# Patient Record
Sex: Female | Born: 1945 | Race: White | Hispanic: No | Smoking: Never smoker
Health system: Southern US, Community
[De-identification: ages and names within clinical notes are randomized; demographics above are authoritative.]

## PROBLEM LIST (undated history)

## (undated) DIAGNOSIS — I10 Essential (primary) hypertension: Secondary | ICD-10-CM

## (undated) DIAGNOSIS — F329 Major depressive disorder, single episode, unspecified: Secondary | ICD-10-CM

## (undated) DIAGNOSIS — F419 Anxiety disorder, unspecified: Secondary | ICD-10-CM

## (undated) DIAGNOSIS — E559 Vitamin D deficiency, unspecified: Secondary | ICD-10-CM

## (undated) DIAGNOSIS — F32A Depression, unspecified: Secondary | ICD-10-CM

## (undated) DIAGNOSIS — F0390 Unspecified dementia without behavioral disturbance: Secondary | ICD-10-CM

## (undated) DIAGNOSIS — E785 Hyperlipidemia, unspecified: Secondary | ICD-10-CM

## (undated) DIAGNOSIS — R413 Other amnesia: Secondary | ICD-10-CM

## (undated) DIAGNOSIS — E079 Disorder of thyroid, unspecified: Secondary | ICD-10-CM

## (undated) HISTORY — DX: Other amnesia: R41.3

## (undated) HISTORY — PX: ABDOMINAL HYSTERECTOMY: SHX81

## (undated) HISTORY — DX: Essential (primary) hypertension: I10

## (undated) HISTORY — DX: Disorder of thyroid, unspecified: E07.9

## (undated) HISTORY — DX: Major depressive disorder, single episode, unspecified: F32.9

## (undated) HISTORY — DX: Hyperlipidemia, unspecified: E78.5

## (undated) HISTORY — DX: Depression, unspecified: F32.A

---

## 2004-09-14 ENCOUNTER — Ambulatory Visit: Payer: Self-pay | Admitting: Internal Medicine

## 2005-09-16 ENCOUNTER — Ambulatory Visit: Payer: Self-pay | Admitting: Internal Medicine

## 2006-11-30 ENCOUNTER — Ambulatory Visit: Payer: Self-pay | Admitting: Orthopaedic Surgery

## 2006-12-28 ENCOUNTER — Ambulatory Visit: Payer: Self-pay | Admitting: Orthopaedic Surgery

## 2007-01-04 ENCOUNTER — Ambulatory Visit: Payer: Self-pay | Admitting: Orthopaedic Surgery

## 2007-08-31 ENCOUNTER — Ambulatory Visit: Payer: Self-pay | Admitting: Internal Medicine

## 2008-09-10 ENCOUNTER — Ambulatory Visit: Payer: Self-pay | Admitting: Internal Medicine

## 2009-09-12 ENCOUNTER — Ambulatory Visit: Payer: Self-pay | Admitting: Internal Medicine

## 2010-09-15 ENCOUNTER — Ambulatory Visit: Payer: Self-pay | Admitting: Internal Medicine

## 2010-12-29 ENCOUNTER — Ambulatory Visit: Payer: Self-pay | Admitting: Internal Medicine

## 2011-01-28 ENCOUNTER — Other Ambulatory Visit: Payer: Self-pay | Admitting: Neurology

## 2011-01-28 DIAGNOSIS — G459 Transient cerebral ischemic attack, unspecified: Secondary | ICD-10-CM

## 2011-01-28 DIAGNOSIS — E039 Hypothyroidism, unspecified: Secondary | ICD-10-CM

## 2011-01-28 DIAGNOSIS — I10 Essential (primary) hypertension: Secondary | ICD-10-CM

## 2011-01-28 DIAGNOSIS — E785 Hyperlipidemia, unspecified: Secondary | ICD-10-CM

## 2011-02-04 ENCOUNTER — Ambulatory Visit
Admission: RE | Admit: 2011-02-04 | Discharge: 2011-02-04 | Disposition: A | Discharge status: Home / Self Care | Payer: Medicare Other | Source: Ambulatory Visit | Attending: Neurology | Admitting: Neurology

## 2011-02-04 DIAGNOSIS — I10 Essential (primary) hypertension: Secondary | ICD-10-CM

## 2011-02-04 DIAGNOSIS — E785 Hyperlipidemia, unspecified: Secondary | ICD-10-CM

## 2011-02-04 DIAGNOSIS — G459 Transient cerebral ischemic attack, unspecified: Secondary | ICD-10-CM

## 2011-02-04 DIAGNOSIS — E039 Hypothyroidism, unspecified: Secondary | ICD-10-CM

## 2011-09-17 ENCOUNTER — Ambulatory Visit: Payer: Self-pay | Admitting: Internal Medicine

## 2012-09-18 ENCOUNTER — Ambulatory Visit: Payer: Self-pay | Admitting: Internal Medicine

## 2012-10-19 ENCOUNTER — Ambulatory Visit: Payer: Self-pay | Admitting: Gastroenterology

## 2012-10-30 ENCOUNTER — Ambulatory Visit: Payer: Self-pay | Admitting: Gastroenterology

## 2013-09-19 ENCOUNTER — Ambulatory Visit: Payer: Self-pay

## 2014-09-23 ENCOUNTER — Ambulatory Visit: Payer: Self-pay

## 2014-09-30 DIAGNOSIS — F32A Depression, unspecified: Secondary | ICD-10-CM | POA: Insufficient documentation

## 2014-09-30 DIAGNOSIS — F329 Major depressive disorder, single episode, unspecified: Secondary | ICD-10-CM | POA: Insufficient documentation

## 2015-03-12 ENCOUNTER — Other Ambulatory Visit: Payer: Self-pay | Admitting: Neurology

## 2015-03-12 DIAGNOSIS — R200 Anesthesia of skin: Secondary | ICD-10-CM

## 2015-03-12 DIAGNOSIS — R413 Other amnesia: Secondary | ICD-10-CM

## 2015-03-19 ENCOUNTER — Ambulatory Visit
Admission: RE | Admit: 2015-03-19 | Discharge: 2015-03-19 | Disposition: A | Discharge status: Home / Self Care | Payer: Medicare Other | Source: Ambulatory Visit | Attending: Neurology | Admitting: Neurology

## 2015-03-19 DIAGNOSIS — R531 Weakness: Secondary | ICD-10-CM | POA: Diagnosis present

## 2015-03-19 DIAGNOSIS — I6782 Cerebral ischemia: Secondary | ICD-10-CM | POA: Insufficient documentation

## 2015-03-19 DIAGNOSIS — R200 Anesthesia of skin: Secondary | ICD-10-CM | POA: Diagnosis present

## 2015-03-19 DIAGNOSIS — R413 Other amnesia: Secondary | ICD-10-CM | POA: Diagnosis present

## 2016-01-23 ENCOUNTER — Other Ambulatory Visit: Payer: Self-pay | Admitting: Infectious Diseases

## 2016-01-23 DIAGNOSIS — Z1231 Encounter for screening mammogram for malignant neoplasm of breast: Secondary | ICD-10-CM

## 2016-01-26 ENCOUNTER — Ambulatory Visit
Admission: RE | Admit: 2016-01-26 | Discharge: 2016-01-26 | Disposition: A | Discharge status: Home / Self Care | Payer: Medicare Other | Source: Ambulatory Visit | Attending: Infectious Diseases | Admitting: Infectious Diseases

## 2016-01-26 ENCOUNTER — Other Ambulatory Visit: Payer: Self-pay | Admitting: Infectious Diseases

## 2016-01-26 DIAGNOSIS — Z1231 Encounter for screening mammogram for malignant neoplasm of breast: Secondary | ICD-10-CM | POA: Diagnosis not present

## 2016-07-24 DIAGNOSIS — F0280 Dementia in other diseases classified elsewhere without behavioral disturbance: Secondary | ICD-10-CM | POA: Insufficient documentation

## 2016-07-24 DIAGNOSIS — G3101 Pick's disease: Secondary | ICD-10-CM

## 2016-10-04 DIAGNOSIS — G8929 Other chronic pain: Secondary | ICD-10-CM | POA: Insufficient documentation

## 2016-10-04 DIAGNOSIS — M542 Cervicalgia: Secondary | ICD-10-CM

## 2017-01-12 ENCOUNTER — Other Ambulatory Visit: Payer: Self-pay | Admitting: Neurology

## 2017-01-12 DIAGNOSIS — R413 Other amnesia: Secondary | ICD-10-CM

## 2017-01-17 ENCOUNTER — Ambulatory Visit: Payer: Federal, State, Local not specified - PPO

## 2017-01-27 ENCOUNTER — Ambulatory Visit
Admission: RE | Admit: 2017-01-27 | Discharge: 2017-01-27 | Disposition: A | Discharge status: Home / Self Care | Payer: Medicare Other | Source: Ambulatory Visit | Attending: Neurology | Admitting: Neurology

## 2017-01-27 DIAGNOSIS — I672 Cerebral atherosclerosis: Secondary | ICD-10-CM | POA: Diagnosis not present

## 2017-01-27 DIAGNOSIS — R41841 Cognitive communication deficit: Secondary | ICD-10-CM | POA: Diagnosis present

## 2017-01-27 DIAGNOSIS — I6782 Cerebral ischemia: Secondary | ICD-10-CM | POA: Diagnosis not present

## 2017-01-27 DIAGNOSIS — R413 Other amnesia: Secondary | ICD-10-CM

## 2017-03-09 ENCOUNTER — Other Ambulatory Visit: Payer: Self-pay | Admitting: Infectious Diseases

## 2017-03-09 DIAGNOSIS — Z1231 Encounter for screening mammogram for malignant neoplasm of breast: Secondary | ICD-10-CM

## 2017-03-24 ENCOUNTER — Ambulatory Visit
Admission: RE | Admit: 2017-03-24 | Discharge: 2017-03-24 | Disposition: A | Discharge status: Home / Self Care | Payer: Medicare Other | Source: Ambulatory Visit | Attending: Infectious Diseases | Admitting: Infectious Diseases

## 2017-03-24 DIAGNOSIS — Z1231 Encounter for screening mammogram for malignant neoplasm of breast: Secondary | ICD-10-CM | POA: Diagnosis present

## 2017-11-14 ENCOUNTER — Ambulatory Visit
Admission: RE | Admit: 2017-11-14 | Discharge: 2017-11-14 | Disposition: A | Discharge status: Home / Self Care | Payer: Medicare Other | Source: Ambulatory Visit | Attending: Physician Assistant | Admitting: Physician Assistant

## 2017-11-14 ENCOUNTER — Other Ambulatory Visit: Payer: Self-pay | Admitting: Physician Assistant

## 2017-11-14 DIAGNOSIS — R4182 Altered mental status, unspecified: Secondary | ICD-10-CM | POA: Diagnosis present

## 2017-11-14 DIAGNOSIS — G319 Degenerative disease of nervous system, unspecified: Secondary | ICD-10-CM | POA: Diagnosis not present

## 2017-11-14 DIAGNOSIS — R410 Disorientation, unspecified: Secondary | ICD-10-CM

## 2017-11-14 DIAGNOSIS — G9389 Other specified disorders of brain: Secondary | ICD-10-CM | POA: Diagnosis not present

## 2017-12-12 ENCOUNTER — Other Ambulatory Visit
Admission: RE | Admit: 2017-12-12 | Discharge: 2017-12-12 | Disposition: A | Discharge status: Home / Self Care | Payer: Medicare Other | Source: Ambulatory Visit | Attending: Family Medicine | Admitting: Family Medicine

## 2017-12-12 ENCOUNTER — Ambulatory Visit (INDEPENDENT_AMBULATORY_CARE_PROVIDER_SITE_OTHER): Payer: Medicare Other | Admitting: Family Medicine

## 2017-12-12 ENCOUNTER — Encounter: Payer: Self-pay | Admitting: Family Medicine

## 2017-12-12 VITALS — BP 117/78 | HR 62 | Resp 16 | Ht 62.0 in | Wt 134.0 lb

## 2017-12-12 DIAGNOSIS — E785 Hyperlipidemia, unspecified: Secondary | ICD-10-CM | POA: Diagnosis not present

## 2017-12-12 DIAGNOSIS — F0150 Vascular dementia without behavioral disturbance: Secondary | ICD-10-CM

## 2017-12-12 DIAGNOSIS — I10 Essential (primary) hypertension: Secondary | ICD-10-CM

## 2017-12-12 DIAGNOSIS — F0280 Dementia in other diseases classified elsewhere without behavioral disturbance: Secondary | ICD-10-CM

## 2017-12-12 DIAGNOSIS — E039 Hypothyroidism, unspecified: Secondary | ICD-10-CM | POA: Diagnosis not present

## 2017-12-12 DIAGNOSIS — R2681 Unsteadiness on feet: Secondary | ICD-10-CM | POA: Insufficient documentation

## 2017-12-12 DIAGNOSIS — E119 Type 2 diabetes mellitus without complications: Secondary | ICD-10-CM | POA: Diagnosis not present

## 2017-12-12 DIAGNOSIS — M542 Cervicalgia: Secondary | ICD-10-CM | POA: Diagnosis not present

## 2017-12-12 DIAGNOSIS — G8929 Other chronic pain: Secondary | ICD-10-CM

## 2017-12-12 DIAGNOSIS — G309 Alzheimer's disease, unspecified: Secondary | ICD-10-CM

## 2017-12-12 DIAGNOSIS — E559 Vitamin D deficiency, unspecified: Secondary | ICD-10-CM

## 2017-12-12 DIAGNOSIS — F329 Major depressive disorder, single episode, unspecified: Secondary | ICD-10-CM | POA: Diagnosis not present

## 2017-12-12 DIAGNOSIS — F32A Depression, unspecified: Secondary | ICD-10-CM

## 2017-12-12 LAB — VITAMIN B12: Vitamin B-12: 496 pg/mL (ref 180–914)

## 2017-12-12 MED ORDER — MIRTAZAPINE 7.5 MG PO TABS: 7.5000 mg | ORAL_TABLET | Freq: Every day | ORAL | 2 refills | Status: DC

## 2017-12-12 NOTE — Patient Instructions (Addendum)
Stop clonazepam, quetiapine, diclofenac, and sertraline. Begin mirtazapine at bedtime.

## 2017-12-12 NOTE — Progress Notes (Signed)
Date:  12/12/2017   Name:  Erica Morton   DOB:  Mar 07, 1946   MRN:  [id number]  PCP:  Schuyler Amor, MD    Chief Complaint: Establish Care   History of Present Illness:  This is a 72 y.o. female seen for initial visit. Hx dementia slowly progressive x years but with occ stairstep progression, most recently last month. Getting in home healthcare 3x/week. Now having difficulty recognizing husband, frequent crying, anxiety, confusion, compulsive behavior. Started on Klonopin last week for agitation/insomnia, not helping. On Seroquel x 1 year, Aricept past 6 months, on memantine in past. Changed from Celexa to Zoloft past year. Hypothyroid on Synthroid, TSH ok last month. On asa/Lipitor for presumed vascular component, no known CVD, MRI in 2016 showed mild chronic small vessel disease, CT negative last month. T2DM off meds, a1c 5.9% LDL 78 in 09-07-2023. Father died 24s NHL, mother died 54s heart dz/smoker, brother healthy. Tet imm unknown, Pneumovax 2014, zoster imm 2015. Colonoscopy in past normal, mammo 02/2017 normal.  Review of Systems:  Review of Systems  Constitutional: Negative for chills and fever.  HENT: Negative for ear pain, sinus pain and trouble swallowing.   Eyes: Negative for pain.  Respiratory: Negative for cough and shortness of breath.   Cardiovascular: Negative for chest pain and leg swelling.  Gastrointestinal: Negative for abdominal pain, constipation and diarrhea.  Genitourinary: Negative for difficulty urinating.  Musculoskeletal: Negative for joint swelling.  Neurological: Negative for syncope and light-headedness.    Patient Active Problem List   Diagnosis Date Noted  . Diabetes mellitus type 2, controlled (HCC) 12/12/2017  . HTN (hypertension) 12/12/2017  . Hypothyroidism, unspecified 12/12/2017  . Pure hypercholesterolemia 12/12/2017  . Vitamin D deficiency 12/12/2017  . Gait instability 12/12/2017  . Chronic neck pain 10/04/2016  . Depression 09/30/2014     Prior to Admission medications   Medication Sig Start Date End Date Taking? Authorizing Provider  aspirin 81 MG chewable tablet Chew by mouth daily.   Yes [provider]  atorvastatin (LIPITOR) 10 MG tablet Take 1 tablet by mouth daily. 11/07/17  Yes [provider]  Calcium Carbonate-Vitamin D (CALCIUM 600+D PO) Take 1 tablet by mouth daily.   Yes [provider]  donepezil (ARICEPT) 10 MG tablet  11/02/17  Yes [provider]  enalapril (VASOTEC) 2.5 MG tablet TAKE 1 TABLET BY MOUTH ONCE DAILY. 10/18/17  Yes [provider]  levothyroxine (SYNTHROID, LEVOTHROID) 100 MCG tablet TAKE 1 TABLET BY MOUTH EVERY MORNING ON AN EMPTY STOMACH WITHA GLASS OF WATER. 11/07/17  Yes [provider]  mirtazapine (REMERON) 7.5 MG tablet Take 1 tablet (7.5 mg total) by mouth at bedtime. 12/12/17   Schuyler Amor, MD    Allergies  Allergen Reactions  . Metformin Diarrhea    Past Surgical History:  Procedure Laterality Date  . ABDOMINAL HYSTERECTOMY      Social History   Tobacco Use  . Smoking status: Never Smoker  . Smokeless tobacco: Never Used  Substance Use Topics  . Alcohol use: No    Frequency: Never  . Drug use: No    Family History  Problem Relation Age of Onset  . Heart attack Mother   . Non-Hodgkin's lymphoma Father   . Breast cancer Neg Hx     Medication list has been reviewed and updated.  Physical Examination: BP 117/78   Pulse 62   Resp 16   Ht 5\' 2"  (1.575 m)   Wt 134 lb (60.8 kg)  SpO2 98%   BMI 24.51 kg/m   Physical Exam  Constitutional: She appears well-developed and well-nourished.  HENT:  Head: Normocephalic and atraumatic.  Right Ear: External ear normal.  Left Ear: External ear normal.  Nose: Nose normal.  Mouth/Throat: Oropharynx is clear and moist.  TMs clear  Eyes: Conjunctivae and EOM are normal. Pupils are equal, round, and reactive to light.  Neck: Neck supple. No thyromegaly present.   Cardiovascular: Normal rate, regular rhythm, normal heart sounds and intact distal pulses.  Pulmonary/Chest: Effort normal and breath sounds normal.  Abdominal: Soft. She exhibits no distension and no mass. There is no tenderness.  Musculoskeletal: She exhibits no edema.  Lymphadenopathy:    She has no cervical adenopathy.  Neurological: She is alert. Coordination normal.  Romberg positive, gait unsteady SLUMS 4/30 with HS education  Skin: Skin is warm and dry.  Psychiatric: She has a normal mood and affect. Her behavior is normal.  GDS 8/15  Nursing note and vitals reviewed.   Assessment and Plan:  1. Controlled type 2 diabetes mellitus without complication, without long-term current use of insulin (HCC) Well controlled off meds - HgB A1c  2. Mixed Alzheimer's and vascular dementia Unclear benefit Aricept, consider change to memantine, d/c Klonopin/Seroquel, cont asa/statin for now  3. Depression, unspecified depression type Poor control on Zoloft, change to Remeron 7.5 mg qhs  4. Essential hypertension Well controlled on low dose enalapril, consider d/c  5. Hypothyroidism, unspecified type Well controlled on Synthroid  6. Hyperlipidemia, unspecified hyperlipidemia type Well controlled on Lipitor  7. Chronic neck pain Unclear benefit Voltaren, d/c  8. Gait instability - B12  9. Vitamin D deficiency - Vitamin D (25 hydroxy)  10. HM Consider Prevnar, Tdap next visit  Return in about 4 weeks (around 01/09/2018).   60 minutes spent with pt/family over half in counseling  Senaya Dicenso M. Kingsley SpittlePlonk, Jr. MD [facility]  12/12/2017

## 2017-12-13 ENCOUNTER — Other Ambulatory Visit: Payer: Self-pay | Admitting: Family Medicine

## 2017-12-13 LAB — VITAMIN D 25 HYDROXY (VIT D DEFICIENCY, FRACTURES): Vit D, 25-Hydroxy: 26 ng/mL — ABNORMAL LOW (ref 30.0–100.0)

## 2017-12-13 LAB — HEMOGLOBIN A1C
Hgb A1c MFr Bld: 5.8 % — ABNORMAL HIGH (ref 4.8–5.6)
Mean Plasma Glucose: 119.76 mg/dL

## 2017-12-13 MED ORDER — VITAMIN D 50 MCG (2000 UT) PO CAPS
1.0000 | ORAL_CAPSULE | Freq: Every day | ORAL | Status: DC
Start: 2017-12-13 — End: 2018-08-29

## 2017-12-19 ENCOUNTER — Telehealth: Payer: Self-pay

## 2017-12-19 ENCOUNTER — Other Ambulatory Visit: Payer: Self-pay | Admitting: Family Medicine

## 2017-12-19 MED ORDER — MIRTAZAPINE 7.5 MG PO TABS: 15.0000 mg | ORAL_TABLET | Freq: Every day | ORAL | 2 refills | Status: DC

## 2017-12-19 MED ORDER — QUETIAPINE FUMARATE 25 MG PO TABS: 25.0000 mg | ORAL_TABLET | Freq: Two times a day (BID) | ORAL | Status: DC | PRN

## 2017-12-19 NOTE — Telephone Encounter (Signed)
Advised 

## 2017-12-19 NOTE — Telephone Encounter (Signed)
May restart Seroquel 25 mg bid prn hallucinations. If not sleeping at night, may increase Remeron to 15 mg qhs.

## 2017-12-19 NOTE — Telephone Encounter (Signed)
Depression meds are working well but she did have to go back on the Seroquel as patient started to hallucinate several times a day and still is refusing to sleep or unable to sleep keeping family awake all the time. Family is very exhausted and wonder if going back on the clonazepam may help or what she should take to help with sleep this all changed after seen here and medications being changed so she is afraid it may be one of the changes. The daughter even sleeps in the room with her to try to help her not be so scared with hallucinating and waking so often.

## 2018-01-06 ENCOUNTER — Other Ambulatory Visit: Payer: Self-pay

## 2018-01-06 ENCOUNTER — Other Ambulatory Visit: Payer: Self-pay | Admitting: Family Medicine

## 2018-01-06 MED ORDER — MIRTAZAPINE 7.5 MG PO TABS: 22.5000 mg | ORAL_TABLET | Freq: Every day | ORAL | 2 refills | Status: DC
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
## 2018-01-09 ENCOUNTER — Encounter: Payer: Self-pay | Admitting: Family Medicine

## 2018-01-09 ENCOUNTER — Ambulatory Visit (INDEPENDENT_AMBULATORY_CARE_PROVIDER_SITE_OTHER): Payer: Medicare Other | Admitting: Family Medicine

## 2018-01-09 VITALS — BP 112/78 | HR 68 | Resp 16 | Ht 62.0 in | Wt 131.0 lb

## 2018-01-09 DIAGNOSIS — F32A Depression, unspecified: Secondary | ICD-10-CM

## 2018-01-09 DIAGNOSIS — E039 Hypothyroidism, unspecified: Secondary | ICD-10-CM

## 2018-01-09 DIAGNOSIS — G309 Alzheimer's disease, unspecified: Secondary | ICD-10-CM | POA: Diagnosis not present

## 2018-01-09 DIAGNOSIS — Z23 Encounter for immunization: Secondary | ICD-10-CM | POA: Diagnosis not present

## 2018-01-09 DIAGNOSIS — F0280 Dementia in other diseases classified elsewhere without behavioral disturbance: Secondary | ICD-10-CM

## 2018-01-09 DIAGNOSIS — I10 Essential (primary) hypertension: Secondary | ICD-10-CM

## 2018-01-09 DIAGNOSIS — E785 Hyperlipidemia, unspecified: Secondary | ICD-10-CM

## 2018-01-09 DIAGNOSIS — F0150 Vascular dementia without behavioral disturbance: Secondary | ICD-10-CM

## 2018-01-09 DIAGNOSIS — E119 Type 2 diabetes mellitus without complications: Secondary | ICD-10-CM

## 2018-01-09 DIAGNOSIS — E559 Vitamin D deficiency, unspecified: Secondary | ICD-10-CM | POA: Diagnosis not present

## 2018-01-09 DIAGNOSIS — F329 Major depressive disorder, single episode, unspecified: Secondary | ICD-10-CM

## 2018-01-09 MED ORDER — MEMANTINE HCL 5 MG PO TABS: 5.0000 mg | ORAL_TABLET | Freq: Two times a day (BID) | ORAL | 2 refills | Status: DC

## 2018-01-09 MED ORDER — MIRTAZAPINE 15 MG PO TABS: 22.5000 mg | ORAL_TABLET | Freq: Every day | ORAL | 2 refills | Status: DC
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
## 2018-01-16 ENCOUNTER — Ambulatory Visit: Payer: Medicare Other | Admitting: Family Medicine

## 2018-01-18 ENCOUNTER — Other Ambulatory Visit: Payer: Self-pay | Admitting: Family Medicine

## 2018-01-18 MED ORDER — MIRTAZAPINE 15 MG PO TABS: 15.0000 mg | ORAL_TABLET | Freq: Every day | ORAL | 2 refills | Status: DC

## 2018-01-18 MED ORDER — MEMANTINE HCL 10 MG PO TABS: 10.0000 mg | ORAL_TABLET | Freq: Two times a day (BID) | ORAL | Status: DC

## 2018-01-23 ENCOUNTER — Ambulatory Visit (INDEPENDENT_AMBULATORY_CARE_PROVIDER_SITE_OTHER): Payer: Medicare Other

## 2018-01-23 DIAGNOSIS — Z111 Encounter for screening for respiratory tuberculosis: Secondary | ICD-10-CM | POA: Diagnosis not present

## 2018-01-30 ENCOUNTER — Encounter: Payer: Self-pay | Admitting: Family Medicine

## 2018-01-30 ENCOUNTER — Other Ambulatory Visit: Payer: Self-pay | Admitting: Family Medicine

## 2018-01-30 ENCOUNTER — Ambulatory Visit (INDEPENDENT_AMBULATORY_CARE_PROVIDER_SITE_OTHER): Payer: Medicare Other | Admitting: Family Medicine

## 2018-01-30 VITALS — BP 130/70 | HR 64 | Ht 62.0 in | Wt 135.0 lb

## 2018-01-30 DIAGNOSIS — R350 Frequency of micturition: Secondary | ICD-10-CM

## 2018-01-30 DIAGNOSIS — G4701 Insomnia due to medical condition: Secondary | ICD-10-CM

## 2018-01-30 LAB — POCT URINALYSIS DIPSTICK
BILIRUBIN UA: NEGATIVE
Glucose, UA: 250
KETONES UA: NEGATIVE
Leukocytes, UA: NEGATIVE
NITRITE UA: NEGATIVE
PH UA: 6 (ref 5.0–8.0)
Protein, UA: NEGATIVE
RBC UA: NEGATIVE
Spec Grav, UA: 1.02 (ref 1.010–1.025)
UROBILINOGEN UA: 0.2 U/dL

## 2018-01-30 LAB — TB SKIN TEST
Induration: 0 mm
TB Skin Test: NEGATIVE

## 2018-01-30 LAB — GLUCOSE, POCT (MANUAL RESULT ENTRY): POC Glucose: 160 mg/dl — AB (ref 70–99)

## 2018-01-30 MED ORDER — MIRTAZAPINE 30 MG PO TABS
30.0000 mg | ORAL_TABLET | Freq: Every day | ORAL | 0 refills | Status: DC
Start: 2018-01-30 — End: 2018-01-30

## 2018-01-30 NOTE — Progress Notes (Signed)
Name: Erica Morton   MRN: [id number]    DOB: [birth year]   Date:01/30/2018       Progress Note  Subjective  Chief Complaint  Chief Complaint  Patient presents with  . fl2    filled out  . Insomnia    not sleeping at night- f/up   . Urinary Tract Infection    Family with patient presents with initial FL-2 for possible placement in future  Insomnia  Primary symptoms: no fragmented sleep, no sleep disturbance, no difficulty falling asleep, no somnolence, no frequent awakening, premature morning awakening, no malaise/fatigue, no napping.   The onset quality is gradual. The problem occurs nightly. The problem has been gradually worsening since onset. Sleeping: varies.  PMH includes: depression, family stress or anxiety.  Urinary Tract Infection   This is a new problem. The current episode started in the past 7 days. Associated symptoms include frequency. Pertinent negatives include no chills, discharge, flank pain, hematuria, hesitancy, nausea, sweats, urgency or vomiting.    No problem-specific Assessment & Plan notes found for this encounter.   Past Medical History:  Diagnosis Date  . Depression   . Hyperlipidemia   . Hypertension   . Memory deficit   . Thyroid disease     Past Surgical History:  Procedure Laterality Date  . ABDOMINAL HYSTERECTOMY      Family History  Problem Relation Age of Onset  . Heart attack Mother   . Non-Hodgkin's lymphoma Father   . Breast cancer Neg Hx     Social History   Socioeconomic History  . Marital status: Married    Spouse name: Not on file  . Number of children: Not on file  . Years of education: Not on file  . Highest education level: Not on file  Occupational History  . Not on file  Social Needs  . Financial resource strain: Not on file  . Food insecurity:    Worry: Not on file    Inability: Not on file  . Transportation needs:    Medical: Not on file    Non-medical: Not on file  Tobacco Use  . Smoking status:  Never Smoker  . Smokeless tobacco: Never Used  Substance and Sexual Activity  . Alcohol use: No    Frequency: Never  . Drug use: No  . Sexual activity: Not on file  Lifestyle  . Physical activity:    Days per week: Not on file    Minutes per session: Not on file  . Stress: Not on file  Relationships  . Social connections:    Talks on phone: Not on file    Gets together: Not on file    Attends religious service: Not on file    Active member of club or organization: Not on file    Attends meetings of clubs or organizations: Not on file    Relationship status: Not on file  . Intimate partner violence:    Fear of current or ex partner: Not on file    Emotionally abused: Not on file    Physically abused: Not on file    Forced sexual activity: Not on file  Other Topics Concern  . Not on file  Social History Narrative  . Not on file    Allergies  Allergen Reactions  . Metformin Diarrhea    Outpatient Medications Prior to Visit  Medication Sig Dispense Refill  . Ascorbic Acid (VITAMIN C WITH ROSE HIPS) 1000 MG tablet Take 1,000 mg by mouth daily.    Marland Kitchen  Cholecalciferol (VITAMIN D) 2000 units CAPS Take 1 capsule (2,000 Units total) by mouth daily. 30 capsule   . docusate sodium (COLACE) 100 MG capsule Take 100 mg by mouth 2 (two) times daily.    Marland Kitchen levothyroxine (SYNTHROID, LEVOTHROID) 100 MCG tablet TAKE 1 TABLET BY MOUTH EVERY MORNING ON AN EMPTY STOMACH WITHA GLASS OF WATER.    . memantine (NAMENDA) 10 MG tablet Take 1 tablet (10 mg total) by mouth 2 (two) times daily.    . mirtazapine (REMERON) 15 MG tablet Take 1 tablet (15 mg total) by mouth at bedtime. (Patient taking differently: Take 30 mg by mouth at bedtime. ) 45 tablet 2  . QUEtiapine (SEROQUEL) 25 MG tablet Take 1 tablet (25 mg total) by mouth 2 (two) times daily as needed.     No facility-administered medications prior to visit.     Review of Systems  Constitutional: Negative for chills, fever, malaise/fatigue and  weight loss.  HENT: Positive for hearing loss. Negative for ear discharge, ear pain and sore throat.   Eyes: Negative for blurred vision.  Respiratory: Negative for cough, sputum production, shortness of breath and wheezing.   Cardiovascular: Negative for chest pain, palpitations and leg swelling.  Gastrointestinal: Negative for abdominal pain, blood in stool, constipation, diarrhea, heartburn, melena, nausea and vomiting.  Genitourinary: Positive for frequency. Negative for dysuria, flank pain, hematuria, hesitancy and urgency.  Musculoskeletal: Negative for back pain, joint pain, myalgias and neck pain.  Skin: Negative for rash.  Neurological: Negative for dizziness, tingling, sensory change, focal weakness and headaches.  Endo/Heme/Allergies: Negative for environmental allergies and polydipsia. Does not bruise/bleed easily.  Psychiatric/Behavioral: Positive for depression. Negative for sleep disturbance and suicidal ideas. The patient is nervous/anxious and has insomnia.      Objective  Vitals:   01/30/18 1555  BP: 130/70  Pulse: 64  Weight: 135 lb (61.2 kg)  Height:  (1.575 m)    Physical Exam  Constitutional: She appears well-developed and well-nourished.  HENT:  Head: Normocephalic.  Right Ear: External ear normal.  Left Ear: External ear normal.  Mouth/Throat: Oropharynx is clear and moist.  Eyes: Pupils are equal, round, and reactive to light. Conjunctivae and EOM are normal. Lids are everted and swept, no foreign bodies found. Left eye exhibits no hordeolum. No foreign body present in the left eye. Right conjunctiva is not injected. Left conjunctiva is not injected. No scleral icterus.  Neck: Normal range of motion. Neck supple. No JVD present. No tracheal deviation present. No thyromegaly present.  Cardiovascular: Normal rate, regular rhythm, normal heart sounds and intact distal pulses. Exam reveals no gallop and no friction rub.  No murmur heard. Pulmonary/Chest:  Effort normal and breath sounds normal. No respiratory distress. She has no wheezes. She has no rales.  Abdominal: Soft. Bowel sounds are normal. She exhibits no mass. There is no hepatosplenomegaly. There is no tenderness. There is no rebound and no guarding.  Musculoskeletal: Normal range of motion. She exhibits no edema.  Lymphadenopathy:    She has no cervical adenopathy.  Neurological: She is alert. She has normal strength. She displays normal reflexes. No cranial nerve deficit.  Skin: Skin is warm. No rash noted.  Psychiatric: She has a normal mood and affect. Her mood appears not anxious. She does not exhibit a depressed mood.  Nursing note and vitals reviewed.     Assessment & Plan  Problem List Items Addressed This Visit    None    Visit Diagnoses  Urinary frequency    -  Primary   Relevant Orders   POCT urinalysis dipstick (Completed)   POCT Glucose (CBG) (Completed)   Insomnia due to medical condition       Relevant Medications   mirtazapine (REMERON) 30 MG tablet      Meds ordered this encounter  Medications  . mirtazapine (REMERON) 30 MG tablet    Sig: Take 1 tablet (30 mg total) by mouth at bedtime.    Dispense:  30 tablet    Refill:  0   FL-2 form discussed with patient and daughter and filled out accordingly.   Dr. [facility]den [facility]  01/30/18

## 2018-02-13 ENCOUNTER — Emergency Department
Admission: EM | Admit: 2018-02-13 | Discharge: 2018-02-13 | Disposition: A | Discharge status: Home / Self Care | Payer: Medicare Other | Attending: Emergency Medicine | Admitting: Emergency Medicine

## 2018-02-13 ENCOUNTER — Other Ambulatory Visit: Payer: Self-pay

## 2018-02-13 ENCOUNTER — Encounter: Payer: Self-pay | Admitting: Emergency Medicine

## 2018-02-13 ENCOUNTER — Ambulatory Visit: Payer: Medicare Other | Admitting: Family Medicine

## 2018-02-13 DIAGNOSIS — G309 Alzheimer's disease, unspecified: Secondary | ICD-10-CM | POA: Diagnosis not present

## 2018-02-13 DIAGNOSIS — Z79899 Other long term (current) drug therapy: Secondary | ICD-10-CM | POA: Insufficient documentation

## 2018-02-13 DIAGNOSIS — F419 Anxiety disorder, unspecified: Secondary | ICD-10-CM | POA: Diagnosis present

## 2018-02-13 DIAGNOSIS — F0280 Dementia in other diseases classified elsewhere without behavioral disturbance: Secondary | ICD-10-CM

## 2018-02-13 DIAGNOSIS — E1165 Type 2 diabetes mellitus with hyperglycemia: Secondary | ICD-10-CM | POA: Insufficient documentation

## 2018-02-13 DIAGNOSIS — F0150 Vascular dementia without behavioral disturbance: Secondary | ICD-10-CM | POA: Diagnosis not present

## 2018-02-13 DIAGNOSIS — F329 Major depressive disorder, single episode, unspecified: Secondary | ICD-10-CM | POA: Diagnosis not present

## 2018-02-13 DIAGNOSIS — F32A Depression, unspecified: Secondary | ICD-10-CM | POA: Diagnosis present

## 2018-02-13 DIAGNOSIS — I10 Essential (primary) hypertension: Secondary | ICD-10-CM | POA: Diagnosis not present

## 2018-02-13 DIAGNOSIS — M542 Cervicalgia: Secondary | ICD-10-CM

## 2018-02-13 DIAGNOSIS — E119 Type 2 diabetes mellitus without complications: Secondary | ICD-10-CM

## 2018-02-13 DIAGNOSIS — R739 Hyperglycemia, unspecified: Secondary | ICD-10-CM

## 2018-02-13 DIAGNOSIS — G8929 Other chronic pain: Secondary | ICD-10-CM | POA: Diagnosis present

## 2018-02-13 LAB — COMPREHENSIVE METABOLIC PANEL
ALBUMIN: 4 g/dL (ref 3.5–5.0)
ALT: 16 U/L (ref 14–54)
AST: 32 U/L (ref 15–41)
Alkaline Phosphatase: 69 U/L (ref 38–126)
Anion gap: 9 (ref 5–15)
BUN: 21 mg/dL — ABNORMAL HIGH (ref 6–20)
CHLORIDE: 103 mmol/L (ref 101–111)
CO2: 22 mmol/L (ref 22–32)
CREATININE: 1 mg/dL (ref 0.44–1.00)
Calcium: 8.8 mg/dL — ABNORMAL LOW (ref 8.9–10.3)
GFR calc Af Amer: >60 mL/min (ref >60)
GFR calc non Af Amer: 55 mL/min — ABNORMAL LOW (ref >60)
GLUCOSE: 309 mg/dL — AB (ref 65–99)
POTASSIUM: 3.8 mmol/L (ref 3.5–5.1)
Sodium: 134 mmol/L — ABNORMAL LOW (ref 135–145)
Total Bilirubin: 0.6 mg/dL (ref 0.3–1.2)
Total Protein: 7.2 g/dL (ref 6.5–8.1)

## 2018-02-13 LAB — CBC
HEMATOCRIT: 41.1 % (ref 35.0–47.0)
HEMOGLOBIN: 14.3 g/dL (ref 12.0–16.0)
MCH: 33.5 pg (ref 26.0–34.0)
MCHC: 34.7 g/dL (ref 32.0–36.0)
MCV: 96.5 fL (ref 80.0–100.0)
Platelets: 249 10*3/uL (ref 150–440)
RBC: 4.26 MIL/uL (ref 3.80–5.20)
RDW: 13.3 % (ref 11.5–14.5)
WBC: 8.2 10*3/uL (ref 3.6–11.0)

## 2018-02-13 LAB — URINE DRUG SCREEN, QUALITATIVE (ARMC ONLY)
Amphetamines, Ur Screen: NOT DETECTED
BARBITURATES, UR SCREEN: NOT DETECTED
Benzodiazepine, Ur Scrn: NOT DETECTED
CANNABINOID 50 NG, UR ~~LOC~~: NOT DETECTED
COCAINE METABOLITE, UR ~~LOC~~: NOT DETECTED
MDMA (Ecstasy)Ur Screen: NOT DETECTED
METHADONE SCREEN, URINE: NOT DETECTED
OPIATE, UR SCREEN: NOT DETECTED
PHENCYCLIDINE (PCP) UR S: NOT DETECTED
Tricyclic, Ur Screen: NOT DETECTED

## 2018-02-13 LAB — ETHANOL: Alcohol, Ethyl (B): <10 mg/dL (ref <10)

## 2018-02-13 LAB — T4, FREE: Free T4: 1.3 ng/dL (ref 0.82–1.77)

## 2018-02-13 LAB — TSH: TSH: 1.438 u[IU]/mL (ref 0.350–4.500)

## 2018-02-13 LAB — GLUCOSE, CAPILLARY: Glucose-Capillary: 142 mg/dL — ABNORMAL HIGH (ref 65–99)

## 2018-02-13 MED ORDER — ALPRAZOLAM 0.25 MG PO TABS: 0.2500 mg | ORAL_TABLET | Freq: Two times a day (BID) | ORAL | 0 refills | Status: AC | PRN

## 2018-02-13 MED ORDER — QUETIAPINE FUMARATE 100 MG PO TABS: 100.0000 mg | ORAL_TABLET | Freq: Every day | ORAL | 1 refills | Status: DC

## 2018-02-13 MED ORDER — INSULIN ASPART 100 UNIT/ML ~~LOC~~ SOLN: 0.0000 [IU] | Freq: Every day | SUBCUTANEOUS | Status: DC

## 2018-02-13 MED ORDER — ALPRAZOLAM 0.5 MG PO TABS
0.2500 mg | ORAL_TABLET | ORAL | Status: AC
  Administered 2018-02-13: 0.25 mg via ORAL
  Filled 2018-02-13: qty 1

## 2018-02-13 MED ORDER — MIRTAZAPINE 45 MG PO TABS: 45.0000 mg | ORAL_TABLET | Freq: Every day | ORAL | 1 refills | Status: DC

## 2018-02-13 MED ORDER — SODIUM CHLORIDE 0.9 % IV BOLUS
1000.0000 mL | Freq: Once | INTRAVENOUS | Status: AC
  Administered 2018-02-13: 1000 mL via INTRAVENOUS

## 2018-02-13 MED ORDER — INSULIN ASPART 100 UNIT/ML ~~LOC~~ SOLN: 0.0000 [IU] | Freq: Three times a day (TID) | SUBCUTANEOUS | Status: DC

## 2018-02-13 NOTE — ED Notes (Signed)
Pt will be discharged this evening.

## 2018-02-13 NOTE — Consult Note (Signed)
[facility] Face-to-Face Psychiatry Consult   Reason for Consult: 72 year old woman with a history of dementia with worsening anxiety brought here from her living facility Referring Physician: Alfred Levins Patient Identification: Erica Morton MRN:  [id number] Principal Diagnosis: Mixed Alzheimer's and vascular dementia Diagnosis:   Patient Active Problem List   Diagnosis Date Noted  . Diabetes mellitus type 2, controlled ([place]) [E11.9] 12/12/2017  . HTN (hypertension) [I10] 12/12/2017  . Hypothyroidism, unspecified [E03.9] 12/12/2017  . Hyperlipidemia [E78.5] 12/12/2017  . Vitamin D deficiency [E55.9] 12/12/2017  . Gait instability [R26.81] 12/12/2017  . Mixed Alzheimer's and vascular dementia [G30.9, F01.50, F02.80] 12/12/2017  . Chronic neck pain [M54.2, G89.29] 10/04/2016  . Depression [F32.9] 09/30/2014    Total Time spent with patient: 1 hour  Subjective:   Erica Morton is a 72 y.o. female patient admitted with patient not able to give any lucid history.  HPI: Patient interviewed chart reviewed.  Spoke with emergency room physician and also spoke with the patient's daughter who is present and gives the most history.  72 year old woman who has been suffering with dementia for a while but has had a very rapid downturn in the last several months.  Additionally a month and a half ago she suffered the trauma of her husband killing himself although it is not clear how much she still consciously retains of this information.  Patient has been placed in [facility] and has been there for the past week and a half.  Today staff insisted on bringing her to the emergency room because her anxiety was becoming disruptive.  No sign of any violence dangerousness or attempts to hurt her self but the patient is obviously incredibly anxious.  Almost tearful most of the time.  Trembling.  She is very demented and has trouble getting even a single sentence together that makes sense.  Patient has been getting  treatment by her primary care doctor and a neurologist and is currently on mirtazapine 30 mg at night, Seroquel 25 mg twice a day and trazodone twice a day.  Social history: Patient has had a huge number of losses recently.  Dementia is rapidly progressing and her husband died a month and a half ago.  Just transitioning into [facility].  Medical history: History of diabetes mild gait instability but otherwise pretty good medical health overall  Substance abuse history: None  Past Psychiatric History: Prior to developing her dimension patient is described as having been always somewhat anxious but not to a degree that required treatment.  She is been on antidepressants though for anxiety and depression for the past year and a half as prescribed by primary care doctor with some alterations.    Risk to Self: Is patient at risk for suicide?: No Risk to Others:   Prior Inpatient Therapy:   Prior Outpatient Therapy:    Past Medical History:  Past Medical History:  Diagnosis Date  . Depression   . Hyperlipidemia   . Hypertension   . Memory deficit   . Thyroid disease     Past Surgical History:  Procedure Laterality Date  . ABDOMINAL HYSTERECTOMY     Family History:  Family History  Problem Relation Age of Onset  . Heart attack Mother   . Non-Hodgkin's lymphoma Father   . Breast cancer Neg Hx    Family Psychiatric  History: None known Social History:  Social History   Substance and Sexual Activity  Alcohol Use No  . Frequency: Never     Social History  Substance and Sexual Activity  Drug Use No    Social History   Socioeconomic History  . Marital status: Married    Spouse name: Not on file  . Number of children: Not on file  . Years of education: Not on file  . Highest education level: Not on file  Occupational History  . Not on file  Social Needs  . Financial resource strain: Not on file  . Food insecurity:    Worry: Not on file    Inability: Not on file  .  Transportation needs:    Medical: Not on file    Non-medical: Not on file  Tobacco Use  . Smoking status: Never Smoker  . Smokeless tobacco: Never Used  Substance and Sexual Activity  . Alcohol use: No    Frequency: Never  . Drug use: No  . Sexual activity: Not on file  Lifestyle  . Physical activity:    Days per week: Not on file    Minutes per session: Not on file  . Stress: Not on file  Relationships  . Social connections:    Talks on phone: Not on file    Gets together: Not on file    Attends religious service: Not on file    Active member of club or organization: Not on file    Attends meetings of clubs or organizations: Not on file    Relationship status: Not on file  Other Topics Concern  . Not on file  Social History Narrative  . Not on file   Additional Social History:    Allergies:   Allergies  Allergen Reactions  . Metformin Diarrhea    Labs:  Results for orders placed or performed during the hospital encounter of 02/13/18 (from the past 48 hour(s))  Comprehensive metabolic panel     Status: Abnormal   Collection Time: 02/13/18  2:45 PM  Result Value Ref Range   Sodium 134 (L) 135 - 145 mmol/L   Potassium 3.8 3.5 - 5.1 mmol/L   Chloride 103 101 - 111 mmol/L   CO2 22 22 - 32 mmol/L   Glucose, Bld 309 (H) 65 - 99 mg/dL   BUN 21 (H) 6 - 20 mg/dL   Creatinine, Ser 1.00 0.44 - 1.00 mg/dL   Calcium 8.8 (L) 8.9 - 10.3 mg/dL   Total Protein 7.2 6.5 - 8.1 g/dL   Albumin 4.0 3.5 - 5.0 g/dL   AST 32 15 - 41 U/L   ALT 16 14 - 54 U/L   Alkaline Phosphatase 69 38 - 126 U/L   Total Bilirubin 0.6 0.3 - 1.2 mg/dL   GFR calc non Af Amer 55 (L) >60 mL/min   GFR calc Af Amer >60 >60 mL/min    Comment: (NOTE) The eGFR has been calculated using the CKD EPI equation. This calculation has not been validated in all clinical situations. eGFR's persistently <60 mL/min signify possible Chronic Kidney Disease.    Anion gap 9 5 - 15    Comment: Performed at [facility], [address] [place], [place] [address]  Ethanol     Status: None   Collection Time: 02/13/18  2:45 PM  Result Value Ref Range   Alcohol, Ethyl (B) <10 <10 mg/dL    Comment: (NOTE) Lowest detectable limit for serum alcohol is 10 mg/dL. For medical purposes only. Performed at [facility], [address], [place], [place] [address]   cbc     Status: None   Collection  Time: 02/13/18  2:45 PM  Result Value Ref Range   WBC 8.2 3.6 - 11.0 K/uL   RBC 4.26 3.80 - 5.20 MIL/uL   Hemoglobin 14.3 12.0 - 16.0 g/dL   HCT 41.1 35.0 - 47.0 %   MCV 96.5 80.0 - 100.0 fL   MCH 33.5 26.0 - 34.0 pg   MCHC 34.7 32.0 - 36.0 g/dL   RDW 13.3 11.5 - 14.5 %   Platelets 249 150 - 440 K/uL    Comment: Performed at [facility], [address], [place], [place] [address]  Urine Drug Screen, Qualitative     Status: None   Collection Time: 02/13/18  2:45 PM  Result Value Ref Range   Tricyclic, Ur Screen NONE DETECTED NONE DETECTED   Amphetamines, Ur Screen NONE DETECTED NONE DETECTED   MDMA (Ecstasy)Ur Screen NONE DETECTED NONE DETECTED   Cocaine Metabolite,Ur [place] NONE DETECTED NONE DETECTED   Opiate, Ur Screen NONE DETECTED NONE DETECTED   Phencyclidine (PCP) Ur S NONE DETECTED NONE DETECTED   Cannabinoid 50 Ng, Ur [place] NONE DETECTED NONE DETECTED   Barbiturates, Ur Screen NONE DETECTED NONE DETECTED   Benzodiazepine, Ur Scrn NONE DETECTED NONE DETECTED   Methadone Scn, Ur NONE DETECTED NONE DETECTED    Comment: (NOTE) Tricyclics + metabolites, urine    Cutoff 1000 ng/mL Amphetamines + metabolites, urine  Cutoff 1000 ng/mL MDMA (Ecstasy), urine              Cutoff 500 ng/mL Cocaine Metabolite, urine          Cutoff 300 ng/mL Opiate + metabolites, urine        Cutoff 300 ng/mL Phencyclidine (PCP), urine         Cutoff 25 ng/mL Cannabinoid, urine                 Cutoff 50 ng/mL Barbiturates + metabolites, urine  Cutoff 200 ng/mL Benzodiazepine, urine               Cutoff 200 ng/mL Methadone, urine                   Cutoff 300 ng/mL The urine drug screen provides only a preliminary, unconfirmed analytical test result and should not be used for non-medical purposes. Clinical consideration and professional judgment should be applied to any positive drug screen result due to possible interfering substances. A more specific alternate chemical method must be used in order to obtain a confirmed analytical result. Gas chromatography / mass spectrometry (GC/MS) is the preferred confirmat ory method. Performed at [facility], [address] [place], [place] [address]   TSH     Status: None   Collection Time: 02/13/18  4:16 PM  Result Value Ref Range   TSH 1.438 0.350 - 4.500 uIU/mL    Comment: Performed by a 3rd Generation assay with a functional sensitivity of <=0.01 uIU/mL. Performed at [facility], [address] [place], [place] [address]   T4, free     Status: None   Collection Time: 02/13/18  4:16 PM  Result Value Ref Range   Free T4 1.30 0.82 - 1.77 ng/dL    Comment: (NOTE) Biotin ingestion may interfere with free T4 tests. If the results are inconsistent with the TSH level, previous test results, or the clinical presentation, then consider biotin interference. If needed, order repeat testing after stopping biotin. Performed at [facility], [address], [place],  [address]     Current  Facility-Administered Medications  Medication Dose Route Frequency Provider Last Rate Last Dose  . ALPRAZolam (XANAX) tablet 0.25 mg  0.25 mg Oral NOW Clapacs, John T, MD      . insulin aspart (novoLOG) injection 0-5 Units  0-5 Units Subcutaneous QHS Alfred Levins, [place], MD      . Derrill Memo ON 02/14/2018] insulin aspart (novoLOG) injection 0-9 Units  0-9 Units Subcutaneous TID WC Alfred Levins, [place], MD      . sodium chloride 0.9 % bolus 1,000 mL  1,000 mL Intravenous Once Alfred Levins, [place], MD 1,935 mL/hr at 02/13/18  1744 1,000 mL at 02/13/18 1744   Current Outpatient Medications  Medication Sig Dispense Refill  . ALPRAZolam (XANAX) 0.25 MG tablet Take 1 tablet (0.25 mg total) by mouth 2 (two) times daily as needed for anxiety. 30 tablet 0  . Ascorbic Acid (VITAMIN C WITH ROSE HIPS) 1000 MG tablet Take 1,000 mg by mouth daily.    . Cholecalciferol (VITAMIN D) 2000 units CAPS Take 1 capsule (2,000 Units total) by mouth daily. 30 capsule   . docusate sodium (COLACE) 100 MG capsule Take 100 mg by mouth 2 (two) times daily.    Marland Kitchen levothyroxine (SYNTHROID, LEVOTHROID) 100 MCG tablet TAKE 1 TABLET BY MOUTH EVERY MORNING ON AN EMPTY STOMACH WITHA GLASS OF WATER.    . memantine (NAMENDA) 10 MG tablet Take 1 tablet (10 mg total) by mouth 2 (two) times daily.    . mirtazapine (REMERON) 45 MG tablet Take 1 tablet (45 mg total) by mouth at bedtime. 30 tablet 1  . QUEtiapine (SEROQUEL) 100 MG tablet Take 1 tablet (100 mg total) by mouth at bedtime. 30 tablet 1    Musculoskeletal: Strength & Muscle Tone: within normal limits Gait & Station: normal Patient leans: N/A  Psychiatric Specialty Exam: Physical Exam  Nursing note and vitals reviewed. Constitutional: She appears well-developed and well-nourished.  HENT:  Head: Normocephalic and atraumatic.  Eyes: Pupils are equal, round, and reactive to light. Conjunctivae are normal.  Neck: Normal range of motion.  Cardiovascular: Regular rhythm and normal heart sounds.  Respiratory: Effort normal. No respiratory distress.  GI: Soft. She exhibits no distension.  Musculoskeletal: Normal range of motion.  Neurological: She is alert.  Skin: Skin is warm and dry.  Psychiatric: Her mood appears anxious. Her affect is labile. Her speech is tangential. She is agitated. She is not aggressive. Thought content is not paranoid. Cognition and memory are impaired. She expresses impulsivity and inappropriate judgment. She expresses no homicidal and no suicidal ideation. She is  noncommunicative. She exhibits abnormal recent memory and abnormal remote memory.    Review of Systems  Unable to perform ROS: Dementia    Blood pressure 138/62, pulse 85, temperature 98.2 F (36.8 C), temperature source Oral, resp. rate 18, height _0  (1.575 m), weight 61.2 kg (135 lb), SpO2 99 %.Body mass index is 24.69 kg/m.  General Appearance: Casual  Eye Contact:  Minimal  Speech:  Garbled and Slow  Volume:  Decreased  Mood:  Anxious  Affect:  Congruent and Inappropriate  Thought Process:  Irrelevant  Orientation:  Negative  Thought Content:  Illogical, Rumination and Tangential  Suicidal Thoughts:  No  Homicidal Thoughts:  No  Memory:  Immediate;   Fair Recent;   Poor Remote;   Poor  Judgement:  Impaired  Insight:  Shallow  Psychomotor Activity:  Restlessness  Concentration:  Concentration: Poor  Recall:  Poor  Fund of Knowledge:  Poor  Language:  Fair  Akathisia:  No  Handed:  Right  AIMS (if indicated):     Assets:  Financial Resources/Insurance Housing Social Support  ADL's:  Impaired  Cognition:  Impaired,  Moderate  Sleep:        Treatment Plan Summary: Medication management and Plan 72 year old woman with dementia who is been declining rapidly.  On top of that recent major life changes and trauma.  Very demented and it is difficult to get a clear history to tell how much of this could also be called a depression.  In any case right now her current medicine does not seem to be controlling her symptoms enough to allow her to comfortably live at [facility].  She does not look like she would probably benefit from hospitalization.  Not under commitment.  I proposed increasing her mirtazapine to 45 mg at night, adding Seroquel 100 mg at night and giving her a prescription for alprazolam 0.25 mg twice a day as needed.  Explained to the patient's daughter that while alprazolam is not usually best used for elderly patients with dementia in this patient's case she is  so incredibly anxious that it is very disruptive to her ability to live.  Daughter is agreeable to the plan.  Reviewed with emergency room physician.  Disposition: No evidence of imminent risk to self or others at present.   Patient does not meet criteria for psychiatric inpatient admission. Supportive therapy provided about ongoing stressors.  Alethia Berthold, MD 02/13/2018 6:05 PM

## 2018-02-13 NOTE — Discharge Instructions (Signed)
Follow up with your doctor for monitoring of your diabetes.

## 2018-02-13 NOTE — ED Notes (Signed)
Saline lock Dcd with cath intact.

## 2018-02-13 NOTE — ED Provider Notes (Signed)
[facility] Emergency Department Provider Note  ____________________________________________  Time seen: Approximately 3:36 PM  I have reviewed the triage vital signs and the nursing notes.   HISTORY  Chief Complaint Psychiatric Evaluation  Level 5 caveat:  Portions of the history and physical were unable to be obtained due to dementia   HPI Erica Morton is a 71 y.o. female with history of Alzheimer's dementia, depression, hypertension, hyperlipidemia, and hypothyroidism who presents from her assisted living facility for increased anxiety.  According to patient's daughter who was at the bedside patient has had progressively worsening anxiety since February.  In Jan 19, 2023 her husband passed away and she moved in with her daughter for a month.  The family was unable to care for her and she was placed in a assisted living facility 10 days ago.  Her anxiety got worse after that.  Today she was asking staff how much longer she was going to live and that she would rather die and she was sent here for evaluation.  No recent changes on patient's medication.  Patient is teary.  She is very confused which is her baseline per her daughter. Past Medical History:  Diagnosis Date  . Depression   . Hyperlipidemia   . Hypertension   . Memory deficit   . Thyroid disease     Patient Active Problem List   Diagnosis Date Noted  . Diabetes mellitus type 2, controlled ([facility]) 12/12/2017  . HTN (hypertension) 12/12/2017  . Hypothyroidism, unspecified 12/12/2017  . Hyperlipidemia 12/12/2017  . Vitamin D deficiency 12/12/2017  . Gait instability 12/12/2017  . Mixed Alzheimer's and vascular dementia 12/12/2017  . Chronic neck pain 10/04/2016  . Depression 09/30/2014    Past Surgical History:  Procedure Laterality Date  . ABDOMINAL HYSTERECTOMY      Prior to Admission medications   Medication Sig Start Date End Date Taking? Authorizing Provider  ALPRAZolam (XANAX) 0.25  MG tablet Take 1 tablet (0.25 mg total) by mouth 2 (two) times daily as needed for anxiety. 02/13/18 03/15/18  Clapacs, Jackquline [place], MD  Ascorbic Acid (VITAMIN C WITH ROSE HIPS) 1000 MG tablet Take 1,000 mg by mouth daily.    [provider]  Cholecalciferol (VITAMIN D) 2000 units CAPS Take 1 capsule (2,000 Units total) by mouth daily. 12/13/17   Plonk, Chrissie Noa, MD  docusate sodium (COLACE) 100 MG capsule Take 100 mg by mouth 2 (two) times daily.    [provider]  levothyroxine (SYNTHROID, LEVOTHROID) 100 MCG tablet TAKE 1 TABLET BY MOUTH EVERY MORNING ON AN EMPTY STOMACH WITHA GLASS OF WATER. 11/07/17   [provider]  memantine (NAMENDA) 10 MG tablet Take 1 tablet (10 mg total) by mouth 2 (two) times daily. 01/18/18   Plonk, Chrissie Noa, MD  mirtazapine (REMERON) 45 MG tablet Take 1 tablet (45 mg total) by mouth at bedtime. 02/13/18   Clapacs, Jackquline [place], MD  QUEtiapine (SEROQUEL) 100 MG tablet Take 1 tablet (100 mg total) by mouth at bedtime. 02/13/18   Clapacs, Jackquline [place], MD    Allergies Metformin  Family History  Problem Relation Age of Onset  . Heart attack Mother   . Non-Hodgkin's lymphoma Father   . Breast cancer Neg Hx     Social History Social History   Tobacco Use  . Smoking status: Never Smoker  . Smokeless tobacco: Never Used  Substance Use Topics  . Alcohol use: No    Frequency: Never  . Drug use: No    Review  of Systems  Constitutional: Negative for fever. Eyes: Negative for visual changes. ENT: Negative for sore throat. Neck: No neck pain  Cardiovascular: Negative for chest pain. Respiratory: Negative for shortness of breath. Gastrointestinal: Negative for abdominal pain, vomiting or diarrhea. Genitourinary: Negative for dysuria. Musculoskeletal: Negative for back pain. Skin: Negative for rash. Neurological: Negative for headaches, weakness or numbness. Psych: No SI or HI. + anxiety and  depression  ____________________________________________   PHYSICAL EXAM:  VITAL SIGNS: ED Triage Vitals  Enc Vitals Group     BP 02/13/18 1423 138/62     Pulse Rate 02/13/18 1423 85     Resp 02/13/18 1423 18     Temp 02/13/18 1423 98.2 F (36.8 C)     Temp Source 02/13/18 1423 Oral     SpO2 02/13/18 1423 99 %     Weight 02/13/18 1424 135 lb (61.2 kg)     Height 02/13/18 1424  (1.575 m)     Head Circumference --      Peak Flow --      Pain Score 02/13/18 1424 0     Pain Loc --      Pain Edu? --      Excl. in GC? --     Constitutional: Alert and oriented x 2, teary, seems sad.  HEENT:      Head: Normocephalic and atraumatic.         Eyes: Conjunctivae are normal. Sclera is non-icteric.       Mouth/Throat: Mucous membranes are moist.       Neck: Supple with no signs of meningismus. Cardiovascular: Regular rate and rhythm. No murmurs, gallops, or rubs. 2+ symmetrical distal pulses are present in all extremities. No JVD. Respiratory: Normal respiratory effort. Lungs are clear to auscultation bilaterally. No wheezes, crackles, or rhonchi.  Gastrointestinal: Soft, non tender, and non distended with positive bowel sounds. No rebound or guarding. Musculoskeletal: Nontender with normal range of motion in all extremities. No edema, cyanosis, or erythema of extremities. Neurologic: Normal speech and language. Face is symmetric. Moving all extremities. No gross focal neurologic deficits are appreciated. Skin: Skin is warm, dry and intact. No rash noted. Psychiatric: Mood and affect are depressed. Speech and behavior are normal.  ____________________________________________   LABS (all labs ordered are listed, but only abnormal results are displayed)  Labs Reviewed  COMPREHENSIVE METABOLIC PANEL - Abnormal; Notable for the following components:      Result Value   Sodium 134 (*)    Glucose, Bld 309 (*)    BUN 21 (*)    Calcium 8.8 (*)    GFR calc non Af Amer 55 (*)     All other components within normal limits  GLUCOSE, CAPILLARY - Abnormal; Notable for the following components:   Glucose-Capillary 142 (*)    All other components within normal limits  ETHANOL  CBC  URINE DRUG SCREEN, QUALITATIVE ([facility] ONLY)  TSH  T4, FREE  URINALYSIS, COMPLETE (UACMP) WITH MICROSCOPIC   ____________________________________________  EKG  none ____________________________________________  RADIOLOGY  none ____________________________________________   PROCEDURES  Procedure(s) performed: None Procedures Critical Care performed:  None ____________________________________________   INITIAL IMPRESSION / ASSESSMENT AND PLAN / ED COURSE  72 y.o. female with history of Alzheimer's dementia, depression, hypertension, hyperlipidemia, and hypothyroidism who presents from her assisted living facility for increased anxiety since moving into assisted living 10 days ago.  Patient is currently at her baseline and has no medical complaints.  Her vitals are within normal limits.  Will check basic labs including UA, CBC, BMP, and thyroid function to rule out any acute medical etiologies for her symptoms.  Will consult psychiatry.    _________________________ 7:10 PM on 02/13/2018 -----------------------------------------  Patient with hyperglycemia which resolved after IV fluids.  According to daughter patient had a hamburger and cookie just prior to coming to the ED. She is not on meds for diabetes which is suppose to be diet controlled. No evidence of DKA. Recommended f.u with PCP for further evaluation. She was seen by Dr. Toni Amend who adjusted some of patient's medications and added Xanax as needed for anxiety.  Patient is now clear for discharge back to her assisted living facility.   As part of my medical decision making, I reviewed the following data within the electronic MEDICAL RECORD NUMBER History obtained from family, Nursing notes reviewed and incorporated, Labs  reviewed , Old chart reviewed, A consult was requested and obtained from this/these consultant(s) Psychiatry, Notes from prior ED visits and [place] Controlled Substance Database    Pertinent labs & imaging results that were available during my care of the patient were reviewed by me and considered in my medical decision making (see chart for details).    ____________________________________________   FINAL CLINICAL IMPRESSION(S) / ED DIAGNOSES  Final diagnoses:  Anxiety  Hyperglycemia      NEW MEDICATIONS STARTED DURING THIS VISIT:  ED Discharge Orders        Ordered    mirtazapine (REMERON) 45 MG tablet  Daily at bedtime     02/13/18 1740    QUEtiapine (SEROQUEL) 100 MG tablet  Daily at bedtime     02/13/18 1740    ALPRAZolam (XANAX) 0.25 MG tablet  2 times daily PRN     02/13/18 1740       Note:  This document was prepared using Dragon voice recognition software and may include unintentional dictation errors.    Don Perking [place], MD 02/13/18 [phone]

## 2018-02-13 NOTE — ED Notes (Signed)
Pt cooperative with staff, but very anxious. Difficult to assess due to diagnosis of dementia. Patient's daughter is at bedside. Maintained on 15 minute checks and observation by security camera for safety.

## 2018-02-13 NOTE — ED Notes (Signed)
Clothing , jeans , shirt , bra, underwear , flipflops,   Eye glasses

## 2018-02-13 NOTE — ED Triage Notes (Signed)
Pt with family from [facility] living with increased anxiety. Has been there for approx a week, prior to was with family.  Pt with a known hx of worsening Dementia.

## 2018-02-13 NOTE — ED Notes (Signed)
Pt given anxiety medication per daughter's request. Maintained on 15 minute checks and observation by security camera for safety.

## 2018-03-03 ENCOUNTER — Other Ambulatory Visit: Payer: Self-pay

## 2018-03-03 NOTE — Progress Notes (Unsigned)
To verify when last appt was

## 2018-03-06 ENCOUNTER — Ambulatory Visit: Payer: Medicare Other | Admitting: Family Medicine

## 2018-08-29 ENCOUNTER — Encounter: Payer: Self-pay | Admitting: Emergency Medicine

## 2018-08-29 ENCOUNTER — Emergency Department
Admission: EM | Admit: 2018-08-29 | Discharge: 2018-08-29 | Disposition: A | Discharge status: Home / Self Care | Payer: Medicare Other | Attending: Emergency Medicine | Admitting: Emergency Medicine

## 2018-08-29 ENCOUNTER — Emergency Department: Payer: Medicare Other

## 2018-08-29 ENCOUNTER — Other Ambulatory Visit: Payer: Self-pay

## 2018-08-29 DIAGNOSIS — E119 Type 2 diabetes mellitus without complications: Secondary | ICD-10-CM | POA: Diagnosis not present

## 2018-08-29 DIAGNOSIS — Z79899 Other long term (current) drug therapy: Secondary | ICD-10-CM | POA: Diagnosis not present

## 2018-08-29 DIAGNOSIS — R4182 Altered mental status, unspecified: Secondary | ICD-10-CM | POA: Diagnosis present

## 2018-08-29 DIAGNOSIS — E039 Hypothyroidism, unspecified: Secondary | ICD-10-CM | POA: Diagnosis not present

## 2018-08-29 DIAGNOSIS — I10 Essential (primary) hypertension: Secondary | ICD-10-CM | POA: Insufficient documentation

## 2018-08-29 DIAGNOSIS — F329 Major depressive disorder, single episode, unspecified: Secondary | ICD-10-CM | POA: Diagnosis not present

## 2018-08-29 DIAGNOSIS — F0391 Unspecified dementia with behavioral disturbance: Secondary | ICD-10-CM | POA: Diagnosis not present

## 2018-08-29 DIAGNOSIS — N3001 Acute cystitis with hematuria: Secondary | ICD-10-CM | POA: Diagnosis not present

## 2018-08-29 DIAGNOSIS — G309 Alzheimer's disease, unspecified: Secondary | ICD-10-CM | POA: Insufficient documentation

## 2018-08-29 HISTORY — DX: Unspecified dementia, unspecified severity, without behavioral disturbance, psychotic disturbance, mood disturbance, and anxiety: F03.90

## 2018-08-29 LAB — CBC WITH DIFFERENTIAL/PLATELET
ABS IMMATURE GRANULOCYTES: 0.03 10*3/uL (ref 0.00–0.07)
Basophils Absolute: 0.1 10*3/uL (ref 0.0–0.1)
Basophils Relative: 1 %
EOS ABS: 0.1 10*3/uL (ref 0.0–0.5)
Eosinophils Relative: 1 %
HCT: 40.7 % (ref 36.0–46.0)
HEMOGLOBIN: 13.7 g/dL (ref 12.0–15.0)
Immature Granulocytes: 0 %
Lymphocytes Relative: 40 %
Lymphs Abs: 3.5 10*3/uL (ref 0.7–4.0)
MCH: 32.8 pg (ref 26.0–34.0)
MCHC: 33.7 g/dL (ref 30.0–36.0)
MCV: 97.4 fL (ref 80.0–100.0)
MONO ABS: 0.6 10*3/uL (ref 0.1–1.0)
MONOS PCT: 6 %
NEUTROS PCT: 52 %
Neutro Abs: 4.5 10*3/uL (ref 1.7–7.7)
Platelets: 247 10*3/uL (ref 150–400)
RBC: 4.18 MIL/uL (ref 3.87–5.11)
RDW: 12.1 % (ref 11.5–15.5)
WBC: 8.8 10*3/uL (ref 4.0–10.5)
nRBC: 0 % (ref 0.0–0.2)

## 2018-08-29 LAB — URINALYSIS, COMPLETE (UACMP) WITH MICROSCOPIC
Bilirubin Urine: NEGATIVE
GLUCOSE, UA: NEGATIVE mg/dL
Hgb urine dipstick: NEGATIVE
KETONES UR: NEGATIVE mg/dL
Nitrite: NEGATIVE
Protein, ur: NEGATIVE mg/dL
Specific Gravity, Urine: 1.009 (ref 1.005–1.030)
Squamous Epithelial / LPF: NONE SEEN (ref 0–5)
pH: 8 (ref 5.0–8.0)

## 2018-08-29 LAB — COMPREHENSIVE METABOLIC PANEL
ALBUMIN: 4.4 g/dL (ref 3.5–5.0)
ALT: 18 U/L (ref 0–44)
AST: 34 U/L (ref 15–41)
Alkaline Phosphatase: 78 U/L (ref 38–126)
Anion gap: 11 (ref 5–15)
BUN: 22 mg/dL (ref 8–23)
CHLORIDE: 104 mmol/L (ref 98–111)
CO2: 27 mmol/L (ref 22–32)
CREATININE: 0.73 mg/dL (ref 0.44–1.00)
Calcium: 9.2 mg/dL (ref 8.9–10.3)
GFR calc Af Amer: >60 mL/min (ref >60)
GLUCOSE: 158 mg/dL — AB (ref 70–99)
POTASSIUM: 4.1 mmol/L (ref 3.5–5.1)
SODIUM: 142 mmol/L (ref 135–145)
Total Bilirubin: 0.5 mg/dL (ref 0.3–1.2)
Total Protein: 7.5 g/dL (ref 6.5–8.1)

## 2018-08-29 LAB — TSH: TSH: 1.436 u[IU]/mL (ref 0.350–4.500)

## 2018-08-29 LAB — T4, FREE: Free T4: 0.97 ng/dL (ref 0.82–1.77)

## 2018-08-29 MED ORDER — OLANZAPINE 5 MG PO TBDP
2.5000 mg | ORAL_TABLET | Freq: Once | ORAL | Status: AC
  Administered 2018-08-29: 2.5 mg via ORAL

## 2018-08-29 MED ORDER — SODIUM CHLORIDE 0.9 % IV BOLUS
1000.0000 mL | Freq: Once | INTRAVENOUS | Status: AC
  Administered 2018-08-29: 1000 mL via INTRAVENOUS

## 2018-08-29 MED ORDER — SODIUM CHLORIDE 0.9 % IV SOLN
1.0000 g | Freq: Once | INTRAVENOUS | Status: AC
  Administered 2018-08-29: 1 g via INTRAVENOUS
  Filled 2018-08-29: qty 10

## 2018-08-29 MED ORDER — CEPHALEXIN 500 MG PO CAPS: 500.0000 mg | ORAL_CAPSULE | Freq: Three times a day (TID) | ORAL | 0 refills | Status: AC

## 2018-08-29 MED ORDER — HALOPERIDOL LACTATE 5 MG/ML IJ SOLN
2.0000 mg | Freq: Once | INTRAMUSCULAR | Status: AC
  Administered 2018-08-29: 2 mg via INTRAVENOUS
  Filled 2018-08-29: qty 1

## 2018-08-29 MED ORDER — HALOPERIDOL LACTATE 5 MG/ML IJ SOLN
1.0000 mg | Freq: Once | INTRAMUSCULAR | Status: AC
  Administered 2018-08-29: 1 mg via INTRAVENOUS
  Filled 2018-08-29: qty 1

## 2018-08-29 MED ORDER — OLANZAPINE 5 MG PO TBDP
ORAL_TABLET | ORAL | Status: AC
  Administered 2018-08-29: 2.5 mg via ORAL
  Filled 2018-08-29: qty 1

## 2018-08-29 MED ORDER — OLANZAPINE 5 MG PO TBDP
2.5000 mg | ORAL_TABLET | Freq: Once | ORAL | Status: AC
  Administered 2018-08-29: 2.5 mg via ORAL
  Filled 2018-08-29: qty 1

## 2018-08-29 NOTE — ED Triage Notes (Signed)
Pt to ED with daughter c/o AMS x2 weeks getting worse.  Pt lives at [facility], was brought in by daughter.  Daughter states pt has hx of dementia but is not at baseline, pt with multiple falls this week.  Pt brought back to ER room, MD at bedside.

## 2018-08-29 NOTE — ED Notes (Signed)
Pt agitated in triage, continuously wanting to pace and needing to be redirected.  Pt will not stay still during vitalization.  Pt taken back to room 11 and helped into bed.  Patient keeps trying to get out of bed, daughter sitting at bedside helping redirect patient.  Medications ordered per Dr. Don PerkingVeronese and administered.  Lorrie, RN updated about situation.

## 2018-08-29 NOTE — Discharge Instructions (Signed)
Please stop trazodone in the morning to prevent sleepiness and falls.

## 2018-08-29 NOTE — ED Notes (Signed)
Pt still appears agitated and trying to climb out of bed at this time. Waiting for pt to remain calm to complete ekg

## 2018-08-29 NOTE — ED Notes (Addendum)
Pt continues to be very agitated despite attempts to calm. Daughter remains at bedside. 1st dose of haldol with no results at all, 2nd dose showing some relief but pt still attempting to get out of bed and talking about moving

## 2018-08-29 NOTE — ED Notes (Signed)
Daughter at bedside sitting with patient.  Daughter instructed on how and when to use call bell.

## 2018-08-29 NOTE — ED Notes (Signed)
Extremely restless and making multiple attempts to get up. Daughter holding her in bed

## 2018-08-29 NOTE — ED Notes (Signed)
Pt up and ambulated to bathroom with minimal assistance. When back in bed pt appears calmer than she was previously

## 2018-08-29 NOTE — ED Provider Notes (Signed)
[facility] Emergency Department Provider Note  ____________________________________________  Time seen: Approximately 6:06 PM  I have reviewed the triage vital signs and the nursing notes.   HISTORY  Chief Complaint Altered Mental Status  Level 5 caveat:  Portions of the history and physical were unable to be obtained due to dementia   HPI Erica Morton is a 72 y.o. female with a history of dementia, hypothyroidism, depression, hypertension, diabetes who presents with her daughter for altered mental status.  According to the daughter patient's dementia has progressed significantly over the last year.  The doctor seems to think she has Lewy body dementia.  Over the last 3 weeks patient has been very agitated and the doctor at the memory care unit has tried patient on several different medications.  Patient has had several falls according to the nursing staff.  Patient's daughter went to visit her today and decided to bring her to the emergency room for evaluation.  According to the daughter patient is not any worse than she was over the last 3 weeks however she is very frustrated with the fact that the doctor in the facility does not seem to know how to control her mother symptoms.  Patient is agitated and trying to get up from the bed, unable to provide any history.  Patient has not been eating well, has not been sleeping according to the daughter.  Past Medical History:  Diagnosis Date  . Dementia (HCC)   . Depression   . Hyperlipidemia   . Hypertension   . Memory deficit   . Thyroid disease     Patient Active Problem List   Diagnosis Date Noted  . Diabetes mellitus type 2, controlled (HCC) 12/12/2017  . HTN (hypertension) 12/12/2017  . Hypothyroidism, unspecified 12/12/2017  . Hyperlipidemia 12/12/2017  . Vitamin D deficiency 12/12/2017  . Gait instability 12/12/2017  . Mixed Alzheimer's and vascular dementia (HCC) 12/12/2017  . Chronic neck  pain 10/04/2016  . Depression 09/30/2014    Past Surgical History:  Procedure Laterality Date  . ABDOMINAL HYSTERECTOMY      Prior to Admission medications   Medication Sig Start Date End Date Taking? Authorizing Provider  Ascorbic Acid (VITAMIN C) 1000 MG tablet Take 1,000 mg by mouth daily.   Yes [provider]  Cholecalciferol (VITAMIN D) 50 MCG (2000 UT) tablet Take 2,000 Units by mouth daily.   Yes [provider]  docusate sodium (COLACE) 100 MG capsule Take 100 mg by mouth 2 (two) times daily.   Yes [provider]  escitalopram (LEXAPRO) 5 MG tablet Take 5 mg by mouth daily.   Yes [provider]  levothyroxine (SYNTHROID, LEVOTHROID) 100 MCG tablet Take 100 mcg by mouth daily.    Yes [provider]  Melatonin 3 MG TABS Take 6 mg by mouth at bedtime.   Yes [provider]  mirtazapine (REMERON) 30 MG tablet Take 30 mg by mouth at bedtime.   Yes [provider]  OLANZapine (ZYPREXA) 2.5 MG tablet Take 2.5 mg by mouth at bedtime.   Yes [provider]  rivastigmine (EXELON) 1.5 MG capsule Take 1.5 mg by mouth daily.   Yes [provider]  traZODone (DESYREL) 25 mg TABS tablet Take 12.5 mg by mouth daily.   Yes [provider]  Vitamin D, Ergocalciferol, (DRISDOL) 1.25 MG (50000 UT) CAPS capsule Take 50,000 Units by mouth every Thursday.   Yes [provider]  cephALEXin (KEFLEX) 500 MG capsule  Take 1 capsule (500 mg total) by mouth 3 (three) times daily for 7 days. 08/29/18 09/05/18  Nita Sickle, MD  gabapentin (NEURONTIN) 100 MG capsule Take 100 mg by mouth 2 (two) times daily. In the morning and mid-afternoon 08/24/18 08/29/18  [provider]  gabapentin (NEURONTIN) 300 MG capsule Take 300 mg by mouth at bedtime. 08/17/18 08/29/18  [provider]  memantine (NAMENDA) 10 MG tablet Take 1 tablet (10 mg total) by mouth 2 (two) times daily. Patient not taking:  Reported on 08/29/2018 01/18/18   Schuyler Amor, MD    Allergies Metformin  Family History  Problem Relation Age of Onset  . Heart attack Mother   . Non-Hodgkin's lymphoma Father   . Breast cancer Neg Hx     Social History Social History   Tobacco Use  . Smoking status: Never Smoker  . Smokeless tobacco: Never Used  Substance Use Topics  . Alcohol use: No    Frequency: Never  . Drug use: No    Review of Systems  Constitutional: Negative for fever. + confusion Gastrointestinal: Negative vomiting or diarrhea.  ____________________________________________   PHYSICAL EXAM:  VITAL SIGNS: ED Triage Vitals  Enc Vitals Group     BP 08/29/18 1717 (!) 237/111     Pulse Rate 08/29/18 1717 94     Resp 08/29/18 1717 18     Temp 08/29/18 1717 (!) 97.5 F (36.4 C)     Temp Source 08/29/18 1717 Axillary     SpO2 08/29/18 1717 98 %     Weight 08/29/18 1718 135 lb (61.2 kg)     Height 08/29/18 1718 5\' 2"  (1.575 m)     Head Circumference --      Peak Flow --      Pain Score --      Pain Loc --      Pain Edu? --      Excl. in GC? --     Constitutional: Agitated, confused, trying to get up from bed, unable to provide any history.   HEENT:      Head: Normocephalic and atraumatic.         Eyes: Conjunctivae are normal. Sclera is non-icteric.       Mouth/Throat: Mucous membranes are moist. Bruise on the lower lip      Neck: Supple with no signs of meningismus.  No C-spine tenderness Cardiovascular: Regular rate and rhythm. No murmurs, gallops, or rubs. 2+ symmetrical distal pulses are present in all extremities. No JVD. Respiratory: Normal respiratory effort. Lungs are clear to auscultation bilaterally. No wheezes, crackles, or rhonchi.  Gastrointestinal: Soft, non tender, and non distended with positive bowel sounds. No rebound or guarding. Musculoskeletal: Nontender with normal range of motion in all extremities. No edema, cyanosis, or erythema of extremities. Neurologic:  Normal speech and language. Face is symmetric. Moving all extremities. No gross focal neurologic deficits are appreciated. Skin: Skin is warm, dry and intact. No rash noted. Psychiatric: Speech and behavior are agitated   ____________________________________________   LABS (all labs ordered are listed, but only abnormal results are displayed)  Labs Reviewed  COMPREHENSIVE METABOLIC PANEL - Abnormal; Notable for the following components:      Result Value   Glucose, Bld 158 (*)    All other components within normal limits  URINALYSIS, COMPLETE (UACMP) WITH MICROSCOPIC - Abnormal; Notable for the following components:   Color, Urine COLORLESS (*)    APPearance CLEAR (*)    Leukocytes, UA MODERATE (*)  Bacteria, UA RARE (*)    All other components within normal limits  URINE CULTURE  CBC WITH DIFFERENTIAL/PLATELET  TSH  T4, FREE   ____________________________________________  EKG  ED ECG REPORT I, Nita Sickle, the attending physician, personally viewed and interpreted this ECG.  Normal sinus rhythm, rate of 70, normal intervals, normal axis, no ST elevations or depressions. ____________________________________________  RADIOLOGY  I have personally reviewed the images performed during this visit and I agree with the Radiologist's read.   Interpretation by Radiologist:  Ct Head Wo Contrast  Result Date: 08/29/2018 CLINICAL DATA:  Altered mental status x2 weeks progressively worsening. EXAM: CT HEAD WITHOUT CONTRAST TECHNIQUE: Contiguous axial images were obtained from the base of the skull through the vertex without intravenous contrast. COMPARISON:  11/14/2017 FINDINGS: Brain: Stable superficial and central atrophy with chronic mild small vessel ischemic disease. No large vascular territory infarct, hemorrhage or midline shift is noted. No intra-axial mass nor extra-axial fluid. Midline fourth ventricle basal cisterns without effacement. Brainstem and cerebellum are  nonacute. Vascular: Atherosclerosis of the carotid siphons without hyperdense vessel sign. Skull: Intact Sinuses/Orbits: Intact Other: None IMPRESSION: Stable atrophy with chronic small vessel ischemic disease. Electronically Signed   By: Tollie Eth M.D.   On: 08/29/2018 21:09      ____________________________________________   PROCEDURES  Procedure(s) performed: None Procedures Critical Care performed:  None ____________________________________________   INITIAL IMPRESSION / ASSESSMENT AND PLAN / ED COURSE   72 y.o. female with a history of dementia, hypothyroidism, depression, hypertension, diabetes who presents with her daughter for confusion x3 weeks in the setting of several recent medication changes.  Patient is agitated, trying to get up from bed, unable to provide any history.  Head CT has been ordered due to several recent falls.  Patient was given p.o. Zyprexa to help with her agitation and to allow for work-up to be performed in the emergency room.  Will check labs to rule out dehydration, AKI, electrolyte abnormalities, urinary tract infection, or thyroid. Will give gentle hydration.     _________________________ 10:45 PM on 08/29/2018 -----------------------------------------  Evaluation of MAR who came with the patient made me concerned about several abnormalities.  Including trazodone at 8 AM, Synthroid listed as a supplement and melatonin listed as a mood stabilizer.  I discussed these abnormalities with patient's daughter.  Also discussed stopping trazodone in the morning which could be contributing to patient's several falls.  Patient's work-up here show a UTI with no signs of sepsis, normal head CT, no other acute findings on lab work.  Patient was given a dose of Rocephin and discharged back on Keflex.  Recommended close follow-up with primary care doctor for reevaluation of patient's medications.   As part of my medical decision making, I reviewed the following data  within the electronic MEDICAL RECORD NUMBER History obtained from family, Nursing notes reviewed and incorporated, Labs reviewed , EKG interpreted , Old EKG reviewed, Old chart reviewed, Radiograph reviewed , Notes from prior ED visits and [place] Controlled Substance Database    Pertinent labs & imaging results that were available during my care of the patient were reviewed by me and considered in my medical decision making (see chart for details).    ____________________________________________   FINAL CLINICAL IMPRESSION(S) / ED DIAGNOSES  Final diagnoses:  Dementia with behavioral disturbance, unspecified dementia type (HCC)  Acute cystitis with hematuria      NEW MEDICATIONS STARTED DURING THIS VISIT:  ED Discharge Orders  Ordered    cephALEXin (KEFLEX) 500 MG capsule  3 times daily     08/29/18 2234           Note:  This document was prepared using Dragon voice recognition software and may include unintentional dictation errors.    Nita SickleVeronese, [place], MD 08/29/18 [phone]

## 2018-08-30 ENCOUNTER — Encounter: Payer: Self-pay | Admitting: Nurse Practitioner

## 2018-08-30 ENCOUNTER — Non-Acute Institutional Stay: Payer: Medicare Other | Admitting: Nurse Practitioner

## 2018-08-30 VITALS — HR 72 | Temp 98.0°F | Resp 18 | Ht 62.0 in | Wt 133.4 lb

## 2018-08-30 DIAGNOSIS — R451 Restlessness and agitation: Secondary | ICD-10-CM

## 2018-08-30 DIAGNOSIS — Z515 Encounter for palliative care: Secondary | ICD-10-CM

## 2018-08-30 DIAGNOSIS — R2681 Unsteadiness on feet: Secondary | ICD-10-CM

## 2018-08-30 DIAGNOSIS — R413 Other amnesia: Secondary | ICD-10-CM

## 2018-08-30 NOTE — Progress Notes (Signed)
[facility] Telephone: [phone] Fax: [phone]  PATIENT NAME: Erica Morton DOB: Jun 01, 1946 MRN: [id number]  PRIMARY CARE PROVIDER:   [facility]  REFERRING PROVIDER:  [facility] [address] [place], [place] [address]  RESPONSIBLE PARTY:   Erica Morton daughter Health Care power-of-attorney [phone]  ASSESSMENT:    I visited and observed Erica Morton. She aroused to verbal cues but did not open her eyes. She was sitting up in the chair during this time. She was cooperative with assessment. No meaningful discussion due to cognitive impairment. Emotional support provided.  I talked at length with clinical director concerning reset medication changes, Falls, altered mental status with UTI. Discussed when Erica Morton was previously on Zyprexa with trazodone her behaviors, agitation seem to be fairly stable. Behaviors, agitation worse  once those were discontinued and Gabapentin started for agitation, sleep. Discuss that will follow up with daughter, Erica Morton update support and further discussion to continue ongoing open communication with staff, facility physician as well as psychiatry.  I called Erica Coaster, Erica Morton daughter. We talked about purpose of palliative medicine visit, Erica Morton in agreement. We talked about clinical presentation and visit with Erica Morton. Discussed that she was very sleepy at the time of the visit but it was later in the afternoon. Updated staff to discuss that Erica. Morton does frequently walk the halls during the day and becomes very tired in the evening. We talked about symptoms of agitation with medications that were tried. We talked about Zyprexa and trazodone that seem to be the most effective which were prescribed by Psychiatry. Erica Morton in agreement with regiment working and when switch to Gabapentin increase in agitation and insomnia. We talked about further discussion with Psychiatry and facility to  possibly restart Zyprexa and trazodone. We talked about recent hospitalization and Erica Morton's concern about her discussion with emergency department physician. Erica Morton she has a list of highlighted medications she in the emergency department physician reviewed for questions for the facility. Encourage Erica Morton to continue with follow-up meeting with facility concerning discharge instructions and her concerns. We talked about progression of chronic disease in the setting of dementia. We talked about insomnia and sleep patterns. We talked about agitation with ongoing progression. We talked about the possibility of Lewy bodies dementia as it previously was mentioned to Erica Morton in the past. We talked about realistic expectations in the setting of dementia. We talked about medical goals to continue to focus on Comfort. Therapeutic listening and emotional support provided. Contact information provided. Questions answered to satisfaction. Talked about follow-up visit for palliative care in one month is needed or Center should she declined depending on out come from meeting with facility and psychiatrist.   Facility weights: 11 / 7 / 2019 weight 134.4 lb 12 / 11/2017 weight 133.4 lb  RECOMMENDATIONS and PLAN:  1. Memory loss R41.3 appears progressive. Medical goals to continue to focus on Comfort, redirecting with supportive measures.  2. . Unsteady gait R26.81 secondary to progression of dementia disease. Continue fall precautions, high-risk, frequent monitoring  3. Agitation R45.1; continue ongoing emotional support, management to include psychiatry for medication management. Redirect as able, continue locked memory unit  4. Palliative care encounter Z51.5; Ongoing monitoring chronic disease management, symptoms, goc with emotional support.  I spent 75 minutes providing this consultation,  from 2:30pm to 3:45PM. More than 50% of the time in this consultation was spent coordinating communication.   HISTORY OF PRESENT  ILLNESS:  Erica Morton is  a 72 year old female with multiple medical problems including Alzheimer's with vascular dementia, hypertension, hyper thyroidism, hyperlipidemia, vitamin D deficiency, chronic neck pain, depression, abdominal hysterectomy. She was seen by Psychiatry 5 / 2019 for increase in anxiety. About a month and a half prior to ED visit she suffered trauma under which her husband killed himself. She was started on Trazodone with Zyprexa under Psychiatry with improvements in anxiety, agitation, sleep. She became having more difficulty with sleeping and Psychiatry discontinued her trazodone, Zyprexa and initiated Gabapentin at bedtime. She has started having recent Falls. She was seen in the emergency department 12/3 / 2019 brought in by her daughter for altered mental status. She did receive a dose of Zyprexa for agitation to be able to proceed with work up during emergency department visit. Recommendation to stop a.m. dose of trazodone which could be contributing to Falls. Work up significant for urinary tract infection no sign of sepsis with normal CT scan received Rocephin and discharged with course of Keflex. She continues to reside in locked facility Assisted Memory Care unit at [facility]. She is ambulatory. She requires assistance, prompting for adl's. She is able to feed herself and her appetite remains good. Staff endorses today she has been a little bit more sleepy. At present she is sitting in the chair in living room asleep. No visitors present. Palliative Care was asked to help address goals of care.   CODE STATUS: DNR  PPS: 40% HOSPICE ELIGIBILITY/DIAGNOSIS: No  PAST MEDICAL HISTORY:  Past Medical History:  Diagnosis Date  . Dementia (HCC)   . Depression   . Hyperlipidemia   . Hypertension   . Memory deficit   . Thyroid disease     SOCIAL HX:  Social History   Tobacco Use  . Smoking status: Never Smoker  . Smokeless tobacco: Never Used  Substance Use Topics  .  Alcohol use: No    Frequency: Never    ALLERGIES:  Allergies  Allergen Reactions  . Metformin Diarrhea     PERTINENT MEDICATIONS:  Outpatient Encounter Medications as of 08/30/2018  Medication Sig  . Ascorbic Acid (VITAMIN C) 1000 MG tablet Take 1,000 mg by mouth daily.  . cephALEXin (KEFLEX) 500 MG capsule Take 1 capsule (500 mg total) by mouth 3 (three) times daily for 7 days.  . Cholecalciferol (VITAMIN D) 50 MCG (2000 UT) tablet Take 2,000 Units by mouth daily.  Marland Kitchen docusate sodium (COLACE) 100 MG capsule Take 100 mg by mouth 2 (two) times daily.  Marland Kitchen escitalopram (LEXAPRO) 5 MG tablet Take 5 mg by mouth daily.  Marland Kitchen gabapentin (NEURONTIN) 100 MG capsule Take 100 mg by mouth 2 (two) times daily. In the morning and mid-afternoon  . gabapentin (NEURONTIN) 300 MG capsule Take 300 mg by mouth at bedtime.  Marland Kitchen levothyroxine (SYNTHROID, LEVOTHROID) 100 MCG tablet Take 100 mcg by mouth daily.   . Melatonin 3 MG TABS Take 6 mg by mouth at bedtime.  . memantine (NAMENDA) 10 MG tablet Take 1 tablet (10 mg total) by mouth 2 (two) times daily. (Patient not taking: Reported on 08/29/2018)  . mirtazapine (REMERON) 30 MG tablet Take 30 mg by mouth at bedtime.  Marland Kitchen OLANZapine (ZYPREXA) 2.5 MG tablet Take 2.5 mg by mouth at bedtime.  . rivastigmine (EXELON) 1.5 MG capsule Take 1.5 mg by mouth daily.  . traZODone (DESYREL) 25 mg TABS tablet Take 12.5 mg by mouth daily.  . Vitamin D, Ergocalciferol, (DRISDOL) 1.25 MG (50000 UT) CAPS capsule Take 50,000 Units  by mouth every Thursday.   No facility-administered encounter medications on file as of 08/30/2018.     PHYSICAL EXAM:   General: NAD, frail appearing, thin Cardiovascular: regular rate and rhythm Pulmonary: clear ant fields Abdomen: soft, nontender, + bowel sounds GU: no suprapubic tenderness Extremities: no edema, no joint deformities Skin: no rashes Neurological: Weakness but otherwise nonfocal  Dereke Neumann Prince RomeZ Tyleah Loh, NP

## 2018-09-01 LAB — URINE CULTURE: Culture: 40000 — AB

## 2018-10-10 ENCOUNTER — Encounter: Payer: Self-pay | Admitting: Nurse Practitioner

## 2018-10-10 ENCOUNTER — Non-Acute Institutional Stay: Payer: Medicare Other | Admitting: Nurse Practitioner

## 2018-10-10 VITALS — HR 84 | Temp 97.0°F | Resp 18 | Wt 132.0 lb

## 2018-10-10 DIAGNOSIS — Z515 Encounter for palliative care: Secondary | ICD-10-CM | POA: Insufficient documentation

## 2018-10-10 DIAGNOSIS — R1310 Dysphagia, unspecified: Secondary | ICD-10-CM

## 2018-10-10 DIAGNOSIS — R413 Other amnesia: Secondary | ICD-10-CM

## 2018-10-10 NOTE — Progress Notes (Signed)
[facility] Telephone: [phone] Fax: [phone]  PATIENT NAME: Erica Morton DOB: [birth year] MRN: [id number]  PRIMARY CARE PROVIDER:   [facility]  REFERRING PROVIDER:  [facility] [address] [place], [place] [address]  RESPONSIBLE PARTY:  Milinda Hirschfeld daughter [phone]   ASSESSMENT:     I visit and observed Erica Morton. She did stop walking when approached and made brief eye contact but then looked directly away. She mumbled words that were difficult to understand. No meaningful discussion due to cognitive impairment. She did hold hands and was cooperative with assessment. She look down most of the palliative care visit. Erica. Morton at this point in time continues to be stable in the setting of dementia. Emotional support provided. I called Erica Morton, Erica Morton his daughter. Talked about purpose for palliative medicine visit and verbal consent obtained. We talked about palliative medicine visit with Erica Morton. We talked about recent fall last week. We talked about progression of dementia although at present time she seems to be stable. We talked about pain of which she does appear to have in their lower back at times seems to be managed with NSAIDs. Erica Morton endorses she has taken NSAIDs for many years due to chronic back pain. We talked about behaviors and sleeping as she appears to be more calm than last palliative care visit. Talked about medication changes with Improvement. She does appear to be calm and at peace. We talked about appetite with two pound weight loss though will continue to monitor and follow with next visit in 4 weeks if needed or sooner should she declined. We talked about role of palliative care and plan of care. She does remaining DNR. Erica Morton in agreement.  11 / 7 / 2019 weight 134.4 lbs 1 / 8 / 2020 weight 132.0 lbs   RECOMMENDATIONS and PLAN:  1.Dysphagia R13.10; secondary to dementia. Continue to  monitor weights, appetite, aspiration precautions. Speech  2. Palliative care encounter Z51.5; Palliative medicine team will continue to support patient, patient's family, and medical team. Visit consisted of counseling and education dealing with the complex and emotionally intense issues of symptom management and palliative care in the setting of serious and potentially life-threatening illness  3. Memory loss R41.3 secondary to dementia. Continue with supportive measures as chronic disease remains Progressive.  I spent 75 minutes providing this consultation,  from 10:15am to 11:30. More than 50% of the time in this consultation was spent coordinating communication.   HISTORY OF PRESENT ILLNESS:  Erica Morton is a 73 y.o. year old female with multiple medical problems including Alzheimer's with vascular dementia, hyperthyroidism, hypertension, hyperlipidemia, vitamin D deficiency, chronic neck pain, depression, anxiety, abdominal hysterectomy. She sustained trauma after her husband killed himself although not sure how much she consciously retains this information. She was placed in the assisted living facility after this incident which was witness by their adult son. She continues to reside in locked memory care unit. She is ambulatory but does require assistance with ADLs. She does have episodes of urinary incontinence. Erica Morton does feed herself with appetite Fair to poor with 2.4 lb weight loss. Staff reports no recent hospitalizations, infections, wounds, Falls. She is cognitively impaired and unable to verbalize her needs. She is a do not resuscitate. At present she is walking in the hall, appears comfortable. No visitors present. Palliative Care was asked to help address goals of care.   CODE STATUS: DNR  PPS: 40% HOSPICE ELIGIBILITY/DIAGNOSIS: TBD  PAST MEDICAL HISTORY:  Past Medical History:  Diagnosis Date  . Dementia (HCC)   . Depression   . Hyperlipidemia   . Hypertension    . Memory deficit   . Thyroid disease     SOCIAL HX:  Social History   Tobacco Use  . Smoking status: Never Smoker  . Smokeless tobacco: Never Used  Substance Use Topics  . Alcohol use: No    Frequency: Never    ALLERGIES:  Allergies  Allergen Reactions  . Metformin Diarrhea     PERTINENT MEDICATIONS:  Outpatient Encounter Medications as of 10/10/2018  Medication Sig  . Ascorbic Acid (VITAMIN C) 1000 MG tablet Take 1,000 mg by mouth daily.  . Cholecalciferol (VITAMIN D) 50 MCG (2000 UT) tablet Take 2,000 Units by mouth daily.  Marland Kitchen. docusate sodium (COLACE) 100 MG capsule Take 100 mg by mouth 2 (two) times daily.  Marland Kitchen. escitalopram (LEXAPRO) 5 MG tablet Take 5 mg by mouth daily.  Marland Kitchen. gabapentin (NEURONTIN) 100 MG capsule Take 100 mg by mouth 2 (two) times daily. In the morning and mid-afternoon  . gabapentin (NEURONTIN) 300 MG capsule Take 300 mg by mouth at bedtime.  Marland Kitchen. levothyroxine (SYNTHROID, LEVOTHROID) 100 MCG tablet Take 100 mcg by mouth daily.   . Melatonin 3 MG TABS Take 6 mg by mouth at bedtime.  . memantine (NAMENDA) 10 MG tablet Take 1 tablet (10 mg total) by mouth 2 (two) times daily. (Patient not taking: Reported on 08/29/2018)  . mirtazapine (REMERON) 30 MG tablet Take 30 mg by mouth at bedtime.  Marland Kitchen. OLANZapine (ZYPREXA) 2.5 MG tablet Take 2.5 mg by mouth at bedtime.  . rivastigmine (EXELON) 1.5 MG capsule Take 1.5 mg by mouth daily.  . traZODone (DESYREL) 25 mg TABS tablet Take 12.5 mg by mouth daily.  . Vitamin D, Ergocalciferol, (DRISDOL) 1.25 MG (50000 UT) CAPS capsule Take 50,000 Units by mouth every Thursday.   No facility-administered encounter medications on file as of 10/10/2018.     PHYSICAL EXAM:   General: frail appearing, thin, elderly, cognitively impaired female Cardiovascular: regular rate and rhythm Pulmonary: clear ant fields Abdomen: soft, nontender, + bowel sounds GU: no suprapubic tenderness Extremities: no edema, no joint deformities Skin: no  rashes Neurological: Weakness but otherwise nonfocal  Christin Prince RomeZ Gusler, NP

## 2018-10-20 ENCOUNTER — Emergency Department: Payer: Medicare Other

## 2018-10-20 ENCOUNTER — Observation Stay
Admission: EM | Admit: 2018-10-20 | Discharge: 2018-10-22 | Disposition: A | Discharge status: Nursing Home (Includes ICF) | Payer: Medicare Other | Attending: Internal Medicine | Admitting: Internal Medicine

## 2018-10-20 ENCOUNTER — Other Ambulatory Visit: Payer: Self-pay

## 2018-10-20 DIAGNOSIS — E860 Dehydration: Secondary | ICD-10-CM | POA: Insufficient documentation

## 2018-10-20 DIAGNOSIS — R443 Hallucinations, unspecified: Secondary | ICD-10-CM | POA: Insufficient documentation

## 2018-10-20 DIAGNOSIS — R262 Difficulty in walking, not elsewhere classified: Secondary | ICD-10-CM | POA: Diagnosis not present

## 2018-10-20 DIAGNOSIS — Z888 Allergy status to other drugs, medicaments and biological substances status: Secondary | ICD-10-CM | POA: Diagnosis not present

## 2018-10-20 DIAGNOSIS — Z79899 Other long term (current) drug therapy: Secondary | ICD-10-CM | POA: Diagnosis not present

## 2018-10-20 DIAGNOSIS — R102 Pelvic and perineal pain: Secondary | ICD-10-CM | POA: Diagnosis not present

## 2018-10-20 DIAGNOSIS — B9689 Other specified bacterial agents as the cause of diseases classified elsewhere: Secondary | ICD-10-CM | POA: Insufficient documentation

## 2018-10-20 DIAGNOSIS — R4182 Altered mental status, unspecified: Secondary | ICD-10-CM | POA: Insufficient documentation

## 2018-10-20 DIAGNOSIS — E119 Type 2 diabetes mellitus without complications: Secondary | ICD-10-CM

## 2018-10-20 DIAGNOSIS — G309 Alzheimer's disease, unspecified: Secondary | ICD-10-CM | POA: Diagnosis not present

## 2018-10-20 DIAGNOSIS — I10 Essential (primary) hypertension: Secondary | ICD-10-CM | POA: Insufficient documentation

## 2018-10-20 DIAGNOSIS — G20 Parkinson's disease: Secondary | ICD-10-CM | POA: Insufficient documentation

## 2018-10-20 DIAGNOSIS — E032 Hypothyroidism due to medicaments and other exogenous substances: Secondary | ICD-10-CM | POA: Insufficient documentation

## 2018-10-20 DIAGNOSIS — E785 Hyperlipidemia, unspecified: Secondary | ICD-10-CM | POA: Diagnosis not present

## 2018-10-20 DIAGNOSIS — W19XXXA Unspecified fall, initial encounter: Secondary | ICD-10-CM | POA: Diagnosis not present

## 2018-10-20 DIAGNOSIS — F329 Major depressive disorder, single episode, unspecified: Secondary | ICD-10-CM | POA: Insufficient documentation

## 2018-10-20 DIAGNOSIS — Z8249 Family history of ischemic heart disease and other diseases of the circulatory system: Secondary | ICD-10-CM | POA: Diagnosis not present

## 2018-10-20 DIAGNOSIS — I959 Hypotension, unspecified: Secondary | ICD-10-CM | POA: Diagnosis not present

## 2018-10-20 DIAGNOSIS — N3000 Acute cystitis without hematuria: Secondary | ICD-10-CM | POA: Insufficient documentation

## 2018-10-20 DIAGNOSIS — S3210XA Unspecified fracture of sacrum, initial encounter for closed fracture: Secondary | ICD-10-CM | POA: Insufficient documentation

## 2018-10-20 DIAGNOSIS — F0150 Vascular dementia without behavioral disturbance: Secondary | ICD-10-CM | POA: Insufficient documentation

## 2018-10-20 DIAGNOSIS — E039 Hypothyroidism, unspecified: Secondary | ICD-10-CM | POA: Diagnosis present

## 2018-10-20 DIAGNOSIS — F0280 Dementia in other diseases classified elsewhere without behavioral disturbance: Secondary | ICD-10-CM | POA: Insufficient documentation

## 2018-10-20 DIAGNOSIS — Z79890 Hormone replacement therapy (postmenopausal): Secondary | ICD-10-CM | POA: Insufficient documentation

## 2018-10-20 LAB — CBC
HCT: 42.6 % (ref 36.0–46.0)
Hemoglobin: 14.6 g/dL (ref 12.0–15.0)
MCH: 32.7 pg (ref 26.0–34.0)
MCHC: 34.3 g/dL (ref 30.0–36.0)
MCV: 95.3 fL (ref 80.0–100.0)
Platelets: 242 10*3/uL (ref 150–400)
RBC: 4.47 MIL/uL (ref 3.87–5.11)
RDW: 12.1 % (ref 11.5–15.5)
WBC: 9.2 10*3/uL (ref 4.0–10.5)
nRBC: 0 % (ref 0.0–0.2)

## 2018-10-20 LAB — BASIC METABOLIC PANEL
Anion gap: 8 (ref 5–15)
BUN: 19 mg/dL (ref 8–23)
CO2: 27 mmol/L (ref 22–32)
Calcium: 9.6 mg/dL (ref 8.9–10.3)
Chloride: 103 mmol/L (ref 98–111)
Creatinine, Ser: 0.62 mg/dL (ref 0.44–1.00)
GFR calc Af Amer: >60 mL/min (ref >60)
GFR calc non Af Amer: >60 mL/min (ref >60)
GLUCOSE: 144 mg/dL — AB (ref 70–99)
Potassium: 4.1 mmol/L (ref 3.5–5.1)
Sodium: 138 mmol/L (ref 135–145)

## 2018-10-20 LAB — URINALYSIS, COMPLETE (UACMP) WITH MICROSCOPIC
Bacteria, UA: NONE SEEN
Bilirubin Urine: NEGATIVE
Glucose, UA: NEGATIVE mg/dL
Hgb urine dipstick: NEGATIVE
Ketones, ur: 5 mg/dL — AB
Leukocytes, UA: NEGATIVE
Nitrite: NEGATIVE
Protein, ur: NEGATIVE mg/dL
Specific Gravity, Urine: 1.015 (ref 1.005–1.030)
WBC, UA: NONE SEEN WBC/hpf (ref 0–5)
pH: 6 (ref 5.0–8.0)

## 2018-10-20 LAB — TROPONIN I: Troponin I: <0.03 ng/mL (ref <0.03)

## 2018-10-20 LAB — T4, FREE: Free T4: 1.05 ng/dL (ref 0.82–1.77)

## 2018-10-20 LAB — TSH: TSH: 2.818 u[IU]/mL (ref 0.350–4.500)

## 2018-10-20 MED ORDER — SODIUM CHLORIDE 0.9% FLUSH: 3.0000 mL | Freq: Once | INTRAVENOUS | Status: DC

## 2018-10-20 MED ORDER — SODIUM CHLORIDE 0.9 % IV BOLUS
1000.0000 mL | Freq: Once | INTRAVENOUS | Status: AC
  Administered 2018-10-20: 1000 mL via INTRAVENOUS

## 2018-10-20 NOTE — ED Provider Notes (Signed)
[facility] Emergency Department Provider Note  ____________________________________________  Time seen: Approximately 10:43 PM  I have reviewed the triage vital signs and the nursing notes.   HISTORY  Chief Complaint Urinary Tract Infection and Weakness    Level 5 Caveat: Portions of the History and Physical including HPI and review of systems are unable to be completely obtained due to patient being a poor historian   HPI Erica Morton is a 73 y.o. female with a history of dementia hypertension hyperlipidemia who comes to [place] from [facility] due to 2 falls today and decreased alertness.  Daughter reports usually this happens when the patient has UTI.  No other symptoms, normal food intake but chronically avoids drinking liquids.  No fever vomiting diarrhea or pain complaints according to daughter.  Daughter does feel that the patient is acting like she has pain in her lower back after the falls which were unwitnessed.  Patient not able to follow commands participate in exam or interview.      Past Medical History:  Diagnosis Date  . Dementia (HCC)   . Depression   . Hyperlipidemia   . Hypertension   . Memory deficit   . Thyroid disease      Patient Active Problem List   Diagnosis Date Noted  . Palliative care encounter 10/10/2018  . Memory deficits 10/10/2018  . Dysphagia 10/10/2018  . Diabetes mellitus type 2, controlled (HCC) 12/12/2017  . HTN (hypertension) 12/12/2017  . Hypothyroidism, unspecified 12/12/2017  . Hyperlipidemia 12/12/2017  . Vitamin D deficiency 12/12/2017  . Gait instability 12/12/2017  . Mixed Alzheimer's and vascular dementia (HCC) 12/12/2017  . Chronic neck pain 10/04/2016  . Depression 09/30/2014     Past Surgical History:  Procedure Laterality Date  . ABDOMINAL HYSTERECTOMY       Prior to Admission medications   Medication Sig Start Date End Date Taking? Authorizing Provider  Ascorbic Acid  (VITAMIN C) 1000 MG tablet Take 1,000 mg by mouth daily.    [provider]  Cholecalciferol (VITAMIN D) 50 MCG (2000 UT) tablet Take 2,000 Units by mouth daily.    [provider]  docusate sodium (COLACE) 100 MG capsule Take 100 mg by mouth 2 (two) times daily.    [provider]  escitalopram (LEXAPRO) 5 MG tablet Take 5 mg by mouth daily.    [provider]  gabapentin (NEURONTIN) 100 MG capsule Take 100 mg by mouth 2 (two) times daily. In the morning and mid-afternoon 08/24/18 08/29/18  [provider]  gabapentin (NEURONTIN) 300 MG capsule Take 300 mg by mouth at bedtime. 08/17/18 08/29/18  [provider]  levothyroxine (SYNTHROID, LEVOTHROID) 100 MCG tablet Take 100 mcg by mouth daily.     [provider]  Melatonin 3 MG TABS Take 6 mg by mouth at bedtime.    [provider]  memantine (NAMENDA) 10 MG tablet Take 1 tablet (10 mg total) by mouth 2 (two) times daily. Patient not taking: Reported on 08/29/2018 01/18/18   Schuyler Amor, MD  mirtazapine (REMERON) 30 MG tablet Take 30 mg by mouth at bedtime.    [provider]  OLANZapine (ZYPREXA) 2.5 MG tablet Take 2.5 mg by mouth at bedtime.    [provider]  rivastigmine (EXELON) 1.5 MG capsule Take 1.5 mg by mouth daily.    [provider]  traZODone (DESYREL) 25 mg TABS tablet Take 12.5 mg by mouth daily.    [provider]  Vitamin  D, Ergocalciferol, (DRISDOL) 1.25 MG (50000 UT) CAPS capsule Take 50,000 Units by mouth every Thursday.    [provider]     Allergies Metformin   Family History  Problem Relation Age of Onset  . Heart attack Mother   . Non-Hodgkin's lymphoma Father   . Breast cancer Neg Hx     Social History Social History   Tobacco Use  . Smoking status: Never Smoker  . Smokeless tobacco: Never Used  Substance Use Topics  . Alcohol use: No    Frequency: Never  . Drug use: No    Review of  Systems Level 5 Caveat: Portions of the History and Physical including HPI and review of systems are unable to be completely obtained due to patient being a poor historian   Constitutional:   No known fever.  ENT:   No rhinorrhea. Cardiovascular:   No chest pain or syncope. Respiratory:   No dyspnea or cough. Gastrointestinal:   Negative for abdominal pain, vomiting and diarrhea.  Musculoskeletal:   Negative for focal pain or swelling ____________________________________________   PHYSICAL EXAM:  VITAL SIGNS: ED Triage Vitals  Enc Vitals Group     BP 10/20/18 1812 (!) 174/69     Pulse Rate 10/20/18 1812 71     Resp 10/20/18 1812 18     Temp 10/20/18 1812 98.2 F (36.8 C)     Temp Source 10/20/18 1812 Axillary     SpO2 10/20/18 1812 99 %     Weight 10/20/18 1813 131 lb (59.4 kg)     Height 10/20/18 1813 5\' 2"  (1.575 m)     Head Circumference --      Peak Flow --      Pain Score 10/20/18 1821 10     Pain Loc --      Pain Edu? --      Excl. in GC? --     Vital signs reviewed, nursing assessments reviewed.   Constitutional:   Somnolent, poorly arousable, ill-appearing Eyes:   Conjunctivae are normal. EOMI. PERRL. ENT      Head:   Normocephalic and atraumatic.      Nose:   No congestion/rhinnorhea.       Mouth/Throat:   Dry mucous membranes, no pharyngeal erythema. No peritonsillar mass.       Neck:   No meningismus. Full ROM.  No midline spinal tenderness Hematological/Lymphatic/Immunilogical:   No cervical lymphadenopathy. Cardiovascular:   RRR. Symmetric bilateral radial and DP pulses.  No murmurs. Cap refill less than 2 seconds. Respiratory:   Normal respiratory effort without tachypnea/retractions. Breath sounds are clear and equal bilaterally. No wheezes/rales/rhonchi. Gastrointestinal:   Soft and nontender. Non distended. There is no CVA tenderness.  No rebound, rigidity, or guarding.  Musculoskeletal:   Normal range of motion in all extremities. No joint effusions.   No lower extremity tenderness.  No edema.  No focal bony tenderness, no midline spinal tenderness Neurologic:   Poor language expression.  Motor grossly intact. Decreased alertness Not able to follow commands Skin:    Skin is warm, dry and intact. No rash noted.  No petechiae, purpura, or bullae.  ____________________________________________    LABS (pertinent positives/negatives) (all labs ordered are listed, but only abnormal results are displayed) Labs Reviewed  BASIC METABOLIC PANEL - Abnormal; Notable for the following components:      Result Value   Glucose, Bld 144 (*)    All other components within normal limits  URINALYSIS, COMPLETE (UACMP) WITH MICROSCOPIC - Abnormal;  Notable for the following components:   Color, Urine YELLOW (*)    APPearance CLEAR (*)    Ketones, ur 5 (*)    All other components within normal limits  URINE CULTURE  CBC  TROPONIN I  TSH  T4, FREE  CBG MONITORING, ED   ____________________________________________   EKG  Interpreted by me Normal sinus rhythm rate of 89, normal axis intervals QRS ST segments and T waves.  ____________________________________________    RADIOLOGY  Dg Lumbar Spine 2-3 Views  Result Date: 10/20/2018 CLINICAL DATA:  Fall with low back pain EXAM: LUMBAR SPINE - 2-3 VIEW COMPARISON:  None. FINDINGS: Lumbar alignment is within normal limits. Vertebral body heights are maintained. Mild degenerative changes at L1-L2, L2-L3 and L3-L4. IMPRESSION: Mild degenerative changes.  No acute osseous abnormality Electronically Signed   By: Jasmine PangKim  Fujinaga M.D.   On: 10/20/2018 21:50   Dg Pelvis 1-2 Views  Result Date: 10/20/2018 CLINICAL DATA:  Fall with back pain EXAM: PELVIS - 1-2 VIEW COMPARISON:  None. FINDINGS: SI joints are non widened. Moderate retained feces at the rectum. Pubic symphysis and rami appear intact. No fracture or malalignment. Mild degenerative changes of both hips. IMPRESSION: No acute osseous abnormality.  Electronically Signed   By: Jasmine PangKim  Fujinaga M.D.   On: 10/20/2018 21:49    ____________________________________________   PROCEDURES Procedures  ____________________________________________  DIFFERENTIAL DIAGNOSIS   Dehydration, electrolyte disturbance, UTI, intracranial hemorrhage, progression of dementia  CLINICAL IMPRESSION / ASSESSMENT AND PLAN / ED COURSE  Pertinent labs & imaging results that were available during my care of the patient were reviewed by me and considered in my medical decision making (see chart for details).    Patient presents with altered mental status, appears dehydrated.  High suspicion for UTI with initial vitals unremarkable.  Will check labs.  Discussed obtaining a CT scan initially with daughter who agrees that patient would not be suitable for surgical intervention and evaluating for intracranial hemorrhage is not a priority.  Clinical Course as of Oct 20 2241  Fri Oct 20, 2018  2241 Now with hypotension, other vital signs normal, afebrile, no leukocytosis, doubt sepsis.  Suspect dehydration as she is known to avoid fluid intake chronically.  Will give IV fluids.  Given lack of other findings, will obtain CT scan of the head and plan for hospitalization due to acute mental status decline.   [PS]    Clinical Course User Index [PS] Sharman CheekStafford, Allyson Tineo, MD     ____________________________________________   FINAL CLINICAL IMPRESSION(S) / ED DIAGNOSES    Final diagnoses:  Altered mental status, unspecified altered mental status type  Hypotension, unspecified hypotension type  Dehydration     ED Discharge Orders    None      Portions of this note were generated with dragon dictation software. Dictation errors may occur despite best attempts at proofreading.   Sharman CheekStafford, Teresa Lemmerman, MD 10/20/18 2248

## 2018-10-20 NOTE — ED Notes (Signed)
Pt assisted up to commode for bowel movement and urination.

## 2018-10-20 NOTE — ED Notes (Signed)
Pt sitting up in bed in no acute distress. Family at bedside. Head of bed elevated to 80 degrees. Vss.

## 2018-10-20 NOTE — ED Triage Notes (Signed)
Pt comes via POV from [facility] with c/o possible UTI. Family states more than normal confusion and that pt has fallen.  Family unsure if pt hit her head. Family states she was called and informed that pt had fallen on her butt and the second time the facility found her getting up from the floor.  Family reports these are similar symptoms pt has had in the past when she had a UTI.

## 2018-10-20 NOTE — ED Notes (Signed)
EDP Stafford notified in person of drop in pt's BP while sleeping in bed. Family at bedside.

## 2018-10-20 NOTE — ED Notes (Signed)
Patient transported to X-ray 

## 2018-10-20 NOTE — ED Notes (Signed)
Pt with daughter and son, reports pt from [facility], fell twice today, pt largely nonverbal (close to baseline per family), pt did report to daughter "butt" was in pain and was tearful in lobby, pt calm att but wants to sit up. Warm blankets given.  Pt back to upper gluteal appears without redness, warmth palpated at lower back.

## 2018-10-21 ENCOUNTER — Observation Stay: Payer: Medicare Other

## 2018-10-21 ENCOUNTER — Encounter: Payer: Self-pay | Admitting: *Deleted

## 2018-10-21 DIAGNOSIS — R4182 Altered mental status, unspecified: Secondary | ICD-10-CM

## 2018-10-21 DIAGNOSIS — I959 Hypotension, unspecified: Secondary | ICD-10-CM | POA: Diagnosis not present

## 2018-10-21 LAB — MRSA PCR SCREENING: MRSA by PCR: NEGATIVE

## 2018-10-21 LAB — AMMONIA: Ammonia: 16 umol/L (ref 9–35)

## 2018-10-21 LAB — GLUCOSE, CAPILLARY
GLUCOSE-CAPILLARY: 115 mg/dL — AB (ref 70–99)
Glucose-Capillary: 106 mg/dL — ABNORMAL HIGH (ref 70–99)
Glucose-Capillary: 116 mg/dL — ABNORMAL HIGH (ref 70–99)

## 2018-10-21 MED ORDER — MIRTAZAPINE 15 MG PO TABS
30.0000 mg | ORAL_TABLET | Freq: Every day | ORAL | Status: DC
  Administered 2018-10-21: 30 mg via ORAL
  Filled 2018-10-21: qty 2

## 2018-10-21 MED ORDER — ENOXAPARIN SODIUM 40 MG/0.4ML ~~LOC~~ SOLN
40.0000 mg | SUBCUTANEOUS | Status: DC
  Administered 2018-10-21: 40 mg via SUBCUTANEOUS
  Filled 2018-10-21: qty 0.4

## 2018-10-21 MED ORDER — ONDANSETRON HCL 4 MG/2ML IJ SOLN: 4.0000 mg | Freq: Four times a day (QID) | INTRAMUSCULAR | Status: DC | PRN

## 2018-10-21 MED ORDER — RIVASTIGMINE TARTRATE 1.5 MG PO CAPS
1.5000 mg | ORAL_CAPSULE | Freq: Every day | ORAL | Status: DC
  Administered 2018-10-21 – 2018-10-22 (×2): 1.5 mg via ORAL
  Filled 2018-10-21 (×3): qty 1

## 2018-10-21 MED ORDER — ONDANSETRON HCL 4 MG PO TABS: 4.0000 mg | ORAL_TABLET | Freq: Four times a day (QID) | ORAL | Status: DC | PRN

## 2018-10-21 MED ORDER — POLYETHYLENE GLYCOL 3350 17 G PO PACK: 17.0000 g | PACK | Freq: Every day | ORAL | Status: DC

## 2018-10-21 MED ORDER — ACETAMINOPHEN 650 MG RE SUPP: 650.0000 mg | Freq: Four times a day (QID) | RECTAL | Status: DC | PRN

## 2018-10-21 MED ORDER — SODIUM CHLORIDE 0.9 % IV SOLN
INTRAVENOUS | Status: AC
  Administered 2018-10-21: 01:00:00 via INTRAVENOUS

## 2018-10-21 MED ORDER — BISACODYL 10 MG RE SUPP
10.0000 mg | Freq: Once | RECTAL | Status: AC
  Administered 2018-10-22: 10 mg via RECTAL
  Filled 2018-10-21: qty 1

## 2018-10-21 MED ORDER — ACETAMINOPHEN 325 MG PO TABS
650.0000 mg | ORAL_TABLET | Freq: Four times a day (QID) | ORAL | Status: DC | PRN
  Administered 2018-10-21: 650 mg via ORAL
  Filled 2018-10-21: qty 2

## 2018-10-21 NOTE — H&P (Signed)
[facility] [place] [facility]   PATIENT NAME: Yola Feltz    MR#:  [id number]  DATE OF BIRTH:  06-26-1946  DATE OF ADMISSION:  10/20/2018  PRIMARY CARE PHYSICIAN: [facility]   REQUESTING/REFERRING PHYSICIAN: Scotty Court, MD  CHIEF COMPLAINT:   Chief Complaint  Patient presents with  . Urinary Tract Infection  . Weakness    HISTORY OF PRESENT ILLNESS:  Kohen Senft  is a 73 y.o. female who presents with chief complaint as above.  Patient presents from memory care facility after having 2 falls and being significantly more lethargic than normal.  Patient is unable to contribute to her HPI due to dementia and her acute condition.  Her family members at bedside and provides a history.  She states that the patient is normally confused, but very active.  She typically is up and walking around frequently despite not being oriented.  Today she has fallen twice, which is atypical for her except when she gets a UTI.  And she has subsequently been very lethargic and sleeping, which is very atypical for her.  Evaluation here in the ED shows evidence for UTI on urinalysis.  Patient is persistently lethargic.  She did have 2 low blood pressure readings early on during her stay in the ED.  She got IV fluids and her blood pressure improved, though it has been somewhat labile.  Hospitalist were called for admission and further evaluation  PAST MEDICAL HISTORY:   Past Medical History:  Diagnosis Date  . Dementia (HCC)   . Depression   . Hyperlipidemia   . Hypertension   . Memory deficit   . Thyroid disease      PAST SURGICAL HISTORY:   Past Surgical History:  Procedure Laterality Date  . ABDOMINAL HYSTERECTOMY       SOCIAL HISTORY:   Social History   Tobacco Use  . Smoking status: Never Smoker  . Smokeless tobacco: Never Used  Substance Use Topics  . Alcohol use: No    Frequency: Never     FAMILY HISTORY:   Family History   Problem Relation Age of Onset  . Heart attack Mother   . Non-Hodgkin's lymphoma Father   . Breast cancer Neg Hx      DRUG ALLERGIES:   Allergies  Allergen Reactions  . Metformin Diarrhea    MEDICATIONS AT HOME:   Prior to Admission medications   Medication Sig Start Date End Date Taking? Authorizing Provider  Ascorbic Acid (VITAMIN C) 1000 MG tablet Take 1,000 mg by mouth daily.    [provider]  Cholecalciferol (VITAMIN D) 50 MCG (2000 UT) tablet Take 2,000 Units by mouth daily.    [provider]  docusate sodium (COLACE) 100 MG capsule Take 100 mg by mouth 2 (two) times daily.    [provider]  escitalopram (LEXAPRO) 5 MG tablet Take 5 mg by mouth daily.    [provider]  gabapentin (NEURONTIN) 100 MG capsule Take 100 mg by mouth 2 (two) times daily. In the morning and mid-afternoon 08/24/18 08/29/18  [provider]  gabapentin (NEURONTIN) 300 MG capsule Take 300 mg by mouth at bedtime. 08/17/18 08/29/18  [provider]  levothyroxine (SYNTHROID, LEVOTHROID) 100 MCG tablet Take 100 mcg by mouth daily.     [provider]  Melatonin 3 MG TABS Take 6 mg by mouth at bedtime.    [provider]  memantine (NAMENDA) 10 MG tablet Take 1 tablet (10 mg  total) by mouth 2 (two) times daily. Patient not taking: Reported on 08/29/2018 01/18/18   Schuyler AmorPlonk, William, MD  mirtazapine (REMERON) 30 MG tablet Take 30 mg by mouth at bedtime.    [provider]  OLANZapine (ZYPREXA) 2.5 MG tablet Take 2.5 mg by mouth at bedtime.    [provider]  rivastigmine (EXELON) 1.5 MG capsule Take 1.5 mg by mouth daily.    [provider]  traZODone (DESYREL) 25 mg TABS tablet Take 12.5 mg by mouth daily.    [provider]  Vitamin D, Ergocalciferol, (DRISDOL) 1.25 MG (50000 UT) CAPS capsule Take 50,000 Units by mouth every Thursday.    [provider]    REVIEW OF SYSTEMS:  Review of  Systems  Unable to perform ROS: Dementia     VITAL SIGNS:   Vitals:   10/20/18 2203 10/20/18 2215 10/20/18 2230 10/20/18 2231  BP: 115/63  (!) 86/48 (!) 90/51  Pulse: 66 (!) 58 (!) 59 (!) 58  Resp: 18     Temp:      TempSrc:      SpO2: 96% 96% 97% 96%  Weight:      Height:       Wt Readings from Last 3 Encounters:  10/20/18 59.4 kg  10/10/18 59.9 kg  08/30/18 60.5 kg    PHYSICAL EXAMINATION:  Physical Exam  Vitals reviewed. Constitutional: She appears well-developed and well-nourished. No distress.  HENT:  Head: Normocephalic and atraumatic.  Mouth/Throat: Oropharynx is clear and moist.  Eyes: Pupils are equal, round, and reactive to light. Conjunctivae and EOM are normal. No scleral icterus.  Neck: Normal range of motion. Neck supple. No JVD present. No thyromegaly present.  Cardiovascular: Normal rate, regular rhythm and intact distal pulses. Exam reveals no gallop and no friction rub.  No murmur heard. Respiratory: Effort normal and breath sounds normal. No respiratory distress. She has no wheezes. She has no rales.  GI: Soft. Bowel sounds are normal. She exhibits no distension. There is no abdominal tenderness.  Musculoskeletal: Normal range of motion.        General: No edema.     Comments: No arthritis, no gout  Lymphadenopathy:    She has no cervical adenopathy.  Neurological:  Unable to fully assess due to dementia and patient's condition  Skin: Skin is warm and dry. No rash noted. No erythema.  Psychiatric:  Unable to assess due to dementia    LABORATORY PANEL:   CBC Recent Labs  Lab 10/20/18 1824  WBC 9.2  HGB 14.6  HCT 42.6  PLT 242   ------------------------------------------------------------------------------------------------------------------  Chemistries  Recent Labs  Lab 10/20/18 1824  NA 138  K 4.1  CL 103  CO2 27  GLUCOSE 144*  BUN 19  CREATININE 0.62  CALCIUM 9.6    ------------------------------------------------------------------------------------------------------------------  Cardiac Enzymes Recent Labs  Lab 10/20/18 1824  TROPONINI <0.03   ------------------------------------------------------------------------------------------------------------------  RADIOLOGY:  Dg Lumbar Spine 2-3 Views  Result Date: 10/20/2018 CLINICAL DATA:  Fall with low back pain EXAM: LUMBAR SPINE - 2-3 VIEW COMPARISON:  None. FINDINGS: Lumbar alignment is within normal limits. Vertebral body heights are maintained. Mild degenerative changes at L1-L2, L2-L3 and L3-L4. IMPRESSION: Mild degenerative changes.  No acute osseous abnormality Electronically Signed   By: Jasmine PangKim  Fujinaga M.D.   On: 10/20/2018 21:50   Dg Pelvis 1-2 Views  Result Date: 10/20/2018 CLINICAL DATA:  Fall with back pain EXAM: PELVIS - 1-2 VIEW COMPARISON:  None. FINDINGS: SI joints are non  widened. Moderate retained feces at the rectum. Pubic symphysis and rami appear intact. No fracture or malalignment. Mild degenerative changes of both hips. IMPRESSION: No acute osseous abnormality. Electronically Signed   By: Jasmine Pang M.D.   On: 10/20/2018 21:49   Ct Head Wo Contrast  Result Date: 10/20/2018 CLINICAL DATA:  Altered mental status EXAM: CT HEAD WITHOUT CONTRAST TECHNIQUE: Contiguous axial images were obtained from the base of the skull through the vertex without intravenous contrast. COMPARISON:  None. FINDINGS: Brain: There is no mass, hemorrhage or extra-axial collection. There is generalized atrophy without lobar predilection. Hypodensity of the white matter is most commonly associated with chronic microvascular disease. Vascular: No abnormal hyperdensity of the major intracranial arteries or dural venous sinuses. No intracranial atherosclerosis. Skull: The visualized skull base, calvarium and extracranial soft tissues are normal. Sinuses/Orbits: No fluid levels or advanced mucosal thickening of  the visualized paranasal sinuses. No mastoid or middle ear effusion. The orbits are normal. IMPRESSION: Chronic small vessel disease without acute intracranial abnormality. Electronically Signed   By: Deatra Robinson M.D.   On: 10/20/2018 23:06    EKG:   Orders placed or performed during the hospital encounter of 10/20/18  . ED EKG  . ED EKG    IMPRESSION AND PLAN:  Principal Problem:   Hypotension -there is perhaps some element of dehydration, her family states that she almost never drinks much in terms of p.o. intake.  Her specific gravity was mid range concentrated.  Perhaps she has some orthostasis when she stood up today and that contributed to her falls.  We will hydrate her with IV fluids throughout the night tonight, her blood pressure has already improved with initial fluids. Active Problems:   Altered mental status -unclear etiology upfront, work-up in the ED was initially within normal limits.  UA does not support suspicion of UTI.  We will get an MRI of her brain to see if she had anything like a stroke.   Diabetes mellitus type 2, controlled (HCC) -patient is not on anti-glycemic's, monitor her glucose levels and treat as needed   Mixed Alzheimer's and vascular dementia (HCC) -continue home medications   Hypothyroidism -continue home dose thyroid replacement   Hyperlipidemia -continue home dose antilipid  Chart review performed and case discussed with ED provider. Labs, imaging and/or ECG reviewed by provider and discussed with patient/family. Management plans discussed with the patient and/or family.  DVT PROPHYLAXIS: SubQ lovenox   GI PROPHYLAXIS:  None  ADMISSION STATUS: Observation  CODE STATUS: Full.  Patient never expressed code wishes per family's report.  Family at bedside states that they are healthcare power of attorney, but they have never considered CODE STATUS for the patient.  I spoke to them about the benefit of determining this, and they will consider  it. Advance Directive Documentation     Most Recent Value  Type of Advance Directive  Healthcare Power of Attorney  Pre-existing out of facility DNR order (yellow form or pink MOST form)  -  "MOST" Form in Place?  -      TOTAL TIME TAKING CARE OF THIS PATIENT: 40 minutes.   Barney Drain 10/21/2018, 12:06 AM  [facility]  Office  [phone]  CC: Primary care physician; [facility]  Note:  This document was prepared using Dragon voice recognition software and may include unintentional dictation errors.

## 2018-10-21 NOTE — Care Management Obs Status (Signed)
MEDICARE OBSERVATION STATUS NOTIFICATION   Patient Details  Name: LYLE PRUYN MRN: [id number] Date of Birth: 1945/10/27   Medicare Observation Status Notification Given:  Yes    Sherren Kerns, RN 10/21/2018, 3:54 PM

## 2018-10-21 NOTE — Consult Note (Signed)
Neurology Consultation Reason for Consult: Altered mental status Referring Physician: Desiree HaneVachani, V  CC: Altered mental status  History is obtained from: Daughter  HPI: Erica Morton is a 73 y.o. female who presented with lethargy and falls.  At baseline she has a fairly debilitating dementia and is mostly nonverbal.  Yesterday, she apparently was more drowsy than usual, and fell.  It appears that she has broken her tailbone.  2 nights ago, the daughter states that she was doing relatively well, close to her baseline, yesterday she felt warm to the touch, though she never checked temperature prior to come to the hospital  On discussion with the daughter, her decline is been fairly rapid, with first signs of dementia 4 to 5 years ago.  Hallucinations have always been very prominent and she was actually started on an antipsychotic at some point in the past, but this caused severe akathisia and had to be discontinued.  She stopped driving 2 years ago began to be unable to do her checkbook.  Over the past 2 years she has progressed to now needing nursing home level care and being unable to speak more than a sentence at a time.    ROS: Unable to obtain due to altered mental status.   Past Medical History:  Diagnosis Date  . Dementia (HCC)   . Depression   . Hyperlipidemia   . Hypertension   . Memory deficit   . Thyroid disease      Family History  Problem Relation Age of Onset  . Heart attack Mother   . Non-Hodgkin's lymphoma Father   . Breast cancer Neg Hx      Social History:  reports that she has never smoked. She has never used smokeless tobacco. She reports that she does not drink alcohol or use drugs.   Exam: Current vital signs: BP 135/70 (BP Location: Right Arm)   Pulse 61   Temp 98.6 F (37 C) (Oral)   Resp 19   Ht 5\' 2"  (1.575 m)   Wt 59.4 kg   SpO2 99%   BMI 23.94 kg/m  Vital signs in last 24 hours: Temp:  [98.2 F (36.8 C)-98.6 F (37 C)] 98.6 F (37 C)  (01/25 0803) Pulse Rate:  [58-71] 61 (01/25 0803) Resp:  [18-19] 19 (01/25 0055) BP: (86-174)/(48-70) 135/70 (01/25 0803) SpO2:  [96 %-100 %] 99 % (01/25 0803) Weight:  [59.4 kg] 59.4 kg (01/25 0111)   Physical Exam  Constitutional: Appears well-developed and well-nourished.  Psych: She does not answer many questions, slightly flattened affect Eyes: No scleral injection HENT: No OP obstrucion Head: Normocephalic.  Cardiovascular: Normal rate and regular rhythm.  Respiratory: Effort normal, non-labored breathing GI: Soft.  No distension. There is no tenderness.  Skin: WDI  Neuro: Mental Status: Patient is awake, alert, she tells me her name, but does not answer any other questions.  She does follow some simple commands.   Cranial Nerves: II: She blinks to threat bilaterally pupils are equal, round, and reactive to light.   III,IV, VI: EOMI without ptosis or diploplia.  V: Facial sensation is symmetric to temperature VII: Facial movement is symmetric.  VIII: hearing is intact to voice X: Uvula elevates symmetrically XI: Shoulder shrug is symmetric. XII: tongue is midline without atrophy or fasciculations.  Motor: She moves all extremities spontaneously, though she does not cooperate with formal testing she appears to move all extremities well.  She does have increased tone, right side slightly >left side with some  cogwheeling. Sensory: She responds to mild stimulation in all 4 extremities Deep Tendon Reflexes: 2+ and symmetric in the biceps and patellae.  Cerebellar: She does not cooperate with testing.   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  Her increased drowsiness could be due to a mild viral illness or constipation.  I suspect that her difficulty walking last night could have been due to pain associated with her sacral fracture.  I would not make any significant changes to her baseline psychiatric medications which is being managed by psychiatry at her memory care center  Recommendations: 1) bowel regimen per internal medicine 2) ammonia 3) continue home Remeron, Exelon   Ritta Slot, MD [facility] [phone]  If 7pm- 7am, please page neurology on call as listed in AMION.

## 2018-10-21 NOTE — Progress Notes (Signed)
[facility] [place] [facility]   PATIENT NAME: Erica Morton    MR#:  [id number]  DATE OF BIRTH:  Dec 22, 1945  SUBJECTIVE:  CHIEF COMPLAINT:   Chief Complaint  Patient presents with  . Urinary Tract Infection  . Weakness   Sent from memory care , due to drowsiness and fall and as per daughter she was also complaining of some pain in her buttocks area. Patient is nonverbal and I found her drowsy during my visit in her room twice first around 9 and the second around 12 noon. REVIEW OF SYSTEMS:   Patient is drowsy and cannot give review of system. ROS  DRUG ALLERGIES:   Allergies  Allergen Reactions  . Metformin Diarrhea    VITALS:  Blood pressure 135/70, pulse 61, temperature 98.6 F (37 C), temperature source Oral, resp. rate 19, height 5\' 2"  (1.575 m), weight 59.4 kg, SpO2 99 %.  PHYSICAL EXAMINATION:  GENERAL:  73 y.o.-year-old patient lying in the bed with no acute distress.  EYES: Pupils equal, round, reactive to light . No scleral icterus. Extraocular muscles intact.  HEENT: Head atraumatic, normocephalic. Oropharynx and nasopharynx clear.  NECK:  Supple, no jugular venous distention. No thyroid enlargement, no tenderness.  LUNGS: Normal breath sounds bilaterally, no wheezing, rales,rhonchi or crepitation. No use of accessory muscles of respiration.  CARDIOVASCULAR: S1, S2 normal. No murmurs, rubs, or gallops.  ABDOMEN: Soft, nontender, nondistended. Bowel sounds present. No organomegaly or mass.  EXTREMITIES: No pedal edema, cyanosis, or clubbing.  NEUROLOGIC: Patient is drowsy, did some moaning sound and barely opens eyes for short.  From painful stimuli but does go back to sleep and does not follow command.  On passive raising of her arms by me, she maintains the posture for a few seconds before slowly putting her hands down on both side.  On moving her legs passively by me she has some grimacing changes on her face. PSYCHIATRIC: The patient is  drowsy.  SKIN: No obvious rash, lesion, or ulcer.   Physical Exam LABORATORY PANEL:   CBC Recent Labs  Lab 10/20/18 1824  WBC 9.2  HGB 14.6  HCT 42.6  PLT 242   ------------------------------------------------------------------------------------------------------------------  Chemistries  Recent Labs  Lab 10/20/18 1824  NA 138  K 4.1  CL 103  CO2 27  GLUCOSE 144*  BUN 19  CREATININE 0.62  CALCIUM 9.6   ------------------------------------------------------------------------------------------------------------------  Cardiac Enzymes Recent Labs  Lab 10/20/18 1824  TROPONINI <0.03   ------------------------------------------------------------------------------------------------------------------  RADIOLOGY:  Dg Chest 1 View  Result Date: 10/21/2018 CLINICAL DATA:  73 year old female with a history of dementia EXAM: CHEST  1 VIEW COMPARISON:  None. FINDINGS: The heart size and mediastinal contours are within normal limits. Both lungs are clear. The visualized skeletal structures are unremarkable. IMPRESSION: No active disease. Electronically Signed   By: Gilmer Mor D.O.   On: 10/21/2018 12:48   Dg Lumbar Spine 2-3 Views  Result Date: 10/20/2018 CLINICAL DATA:  Fall with low back pain EXAM: LUMBAR SPINE - 2-3 VIEW COMPARISON:  None. FINDINGS: Lumbar alignment is within normal limits. Vertebral body heights are maintained. Mild degenerative changes at L1-L2, L2-L3 and L3-L4. IMPRESSION: Mild degenerative changes.  No acute osseous abnormality Electronically Signed   By: Jasmine Pang M.D.   On: 10/20/2018 21:50   Dg Pelvis 1-2 Views  Result Date: 10/20/2018 CLINICAL DATA:  Fall with back pain EXAM: PELVIS - 1-2 VIEW COMPARISON:  None. FINDINGS: SI joints are non widened. Moderate retained feces  at the rectum. Pubic symphysis and rami appear intact. No fracture or malalignment. Mild degenerative changes of both hips. IMPRESSION: No acute osseous abnormality.  Electronically Signed   By: Jasmine PangKim  Fujinaga M.D.   On: 10/20/2018 21:49   Ct Head Wo Contrast  Result Date: 10/20/2018 CLINICAL DATA:  Altered mental status EXAM: CT HEAD WITHOUT CONTRAST TECHNIQUE: Contiguous axial images were obtained from the base of the skull through the vertex without intravenous contrast. COMPARISON:  None. FINDINGS: Brain: There is no mass, hemorrhage or extra-axial collection. There is generalized atrophy without lobar predilection. Hypodensity of the white matter is most commonly associated with chronic microvascular disease. Vascular: No abnormal hyperdensity of the major intracranial arteries or dural venous sinuses. No intracranial atherosclerosis. Skull: The visualized skull base, calvarium and extracranial soft tissues are normal. Sinuses/Orbits: No fluid levels or advanced mucosal thickening of the visualized paranasal sinuses. No mastoid or middle ear effusion. The orbits are normal. IMPRESSION: Chronic small vessel disease without acute intracranial abnormality. Electronically Signed   By: Deatra RobinsonKevin  Herman M.D.   On: 10/20/2018 23:06   Ct Pelvis Wo Contrast  Result Date: 10/21/2018 CLINICAL DATA:  Recent false with posterior pelvic/sacral pain. Dementia. EXAM: CT PELVIS WITHOUT CONTRAST TECHNIQUE: Multidetector CT imaging of the pelvis was performed following the standard protocol without intravenous contrast. COMPARISON:  Radiographs 10/20/2018 FINDINGS: Musculoskeletal: Transverse nondisplaced sacral fracture at the junction of the sacrum and the coccyx as shown on image 83/5. There is a sclerotic band at this level with some central lucency and mild anterior cortical irregularity. We also demonstrate presacral edema corresponding to this location. No well-defined fracture of the sacral ala noted. No other pelvic fracture is identified on today's CT examination. Moderate degenerative hip arthropathy bilaterally with loss of articular space and spurring. There is also spurring  of the pubis. No significant musculotendinous findings. Mild left foraminal impingement at L5-S1 due to facet spurring. Vascular: Aortoiliac atherosclerotic vascular disease. Gastrointestinal: Prominence of stool in the distal colon. Considerable prominence of stool in the rectal vault, with slight stranding along the margins of the rectum such that stercoral colitis could not be totally excluded. Urinary tract: Urinary bladder unremarkable. IMPRESSION: 1. Nondisplaced transverse fracture at the sacrococcygeal junction, with mild associated presacral edema. No visible fractures through the sacral ala or elsewhere in the pelvis. 2. Mild left foraminal impingement at L5-S1 due to facet spurring. 3. There is prominence of stool in the distal colon and most notably in the rectum, along with mild stranding along the rectal wall-low-grade stercoral colitis could not be readily excluded. 4.  Aortic Atherosclerosis (ICD10-I70.0). Electronically Signed   By: Gaylyn RongWalter  Liebkemann M.D.   On: 10/21/2018 13:28   Mr Brain Wo Contrast  Result Date: 10/21/2018 CLINICAL DATA:  Decrease in mental status with falls. EXAM: MRI HEAD WITHOUT CONTRAST TECHNIQUE: Multiplanar, multiecho pulse sequences of the brain and surrounding structures were obtained without intravenous contrast. COMPARISON:  Head CT from yesterday.  Brain MRI 03/19/2015 FINDINGS: Altered mental status with severely motion degraded study that was truncated. No persistent diffusion signal to suggest acute infarct. There is no hydrocephalus, gross collection, or masslike finding. Cerebral volume loss that has progressed from prior MRI, especially the medial temporal lobes in this patient with history of Alzheimer's dementia. On gradient imaging there is question of superficial siderosis along the bilateral frontal lobes. No acute blood products on prior head CT. IMPRESSION: Very motion degraded and truncated study without acute finding, as above. Electronically Signed    By:  Marnee SpringJonathon  Watts M.D.   On: 10/21/2018 07:09    ASSESSMENT AND PLAN:   Principal Problem:   Hypotension Active Problems:   Diabetes mellitus type 2, controlled (HCC)   Hypothyroidism   Hyperlipidemia   Mixed Alzheimer's and vascular dementia (HCC)   * Hypotension - some element of dehydration, her family states that she almost never drinks much in terms of p.o. intake. May have some orthostasis when she stood up and that contributed to her falls.  hydrate her with IV fluids , her blood pressure has already improved with initial fluids.  *  Altered mental status -unclear etiology upfront, work-up in the ED was initially within normal limits.  UA and Xray chest negative MRI brain negative for acute changes. Patient still continue to stay significantly drowsy today.  Part of it could be due to her staying up until late last night as presented to emergency room, but she is not arousable to painful stimuli also so I will call neurology consult to decide if she need EEG or not.  *Pain in pelvic region As per daughter since fall patient was showing signs of pain in her lower back or pelvic area, x-ray pelvis was negative for any fracture but during my exam patient was also showing signs of pain on movement of her lower extremities so I had done CT scan of pelvis which showed nondisplaced fracture of sacrum. Orthopedic consult with Dr. Rosita KeaMenz to decide further management.  As per nurse patient was able to get up and off the bed after the CT scan when she was more awake.  *  Diabetes mellitus type 2, controlled (HCC) -patient is not on anti-glycemic's, monitor her glucose levels and treat as needed  *  Mixed Alzheimer's and vascular dementia (HCC) -continue home medications *  Hypothyroidism -continue home dose thyroid replacement- TSH normal. *  Hyperlipidemia -continue home dose antilipid   All the records are reviewed and case discussed with Care Management/Social Workerr. Management  plans discussed with the patient, family and they are in agreement.  CODE STATUS: Full  TOTAL TIME TAKING CARE OF THIS PATIENT: 45 minutes.  Discussed with patient's daughter about the test results and the treatment plan  POSSIBLE D/C IN 1 DAYS, DEPENDING ON CLINICAL CONDITION.   Altamese DillingVaibhavkumar Azavion Bouillon M.D on 10/21/2018   Between 7am to 6pm - Pager - [phone]  After 6pm go to [email or web address] - Social research officer, governmentpassword EPAS [facility]  [facility]  Office  [phone]  CC: Primary care physician; [employer] [employer]  Note: This dictation was prepared with Dragon dictation along with smaller phrase technology. Any transcriptional errors that result from this process are unintentional.

## 2018-10-22 DIAGNOSIS — R4182 Altered mental status, unspecified: Secondary | ICD-10-CM | POA: Diagnosis not present

## 2018-10-22 DIAGNOSIS — I959 Hypotension, unspecified: Secondary | ICD-10-CM | POA: Diagnosis not present

## 2018-10-22 LAB — URINE CULTURE: Culture: NO GROWTH

## 2018-10-22 LAB — GLUCOSE, CAPILLARY
Glucose-Capillary: 115 mg/dL — ABNORMAL HIGH (ref 70–99)
Glucose-Capillary: 143 mg/dL — ABNORMAL HIGH (ref 70–99)

## 2018-10-22 MED ORDER — BISACODYL 10 MG RE SUPP: 10.0000 mg | Freq: Every day | RECTAL | Status: DC | PRN

## 2018-10-22 MED ORDER — LEVOTHYROXINE SODIUM 100 MCG PO TABS: 100.0000 ug | ORAL_TABLET | Freq: Every day | ORAL | Status: DC

## 2018-10-22 MED ORDER — ACETAMINOPHEN 325 MG PO TABS: 650.0000 mg | ORAL_TABLET | Freq: Three times a day (TID) | ORAL | 0 refills | Status: DC

## 2018-10-22 MED ORDER — MAGNESIUM CITRATE PO SOLN
0.5000 | Freq: Every day | ORAL | Status: DC | PRN
  Filled 2018-10-22: qty 296

## 2018-10-22 MED ORDER — SENNOSIDES-DOCUSATE SODIUM 8.6-50 MG PO TABS
1.0000 | ORAL_TABLET | Freq: Two times a day (BID) | ORAL | Status: DC
  Administered 2018-10-22: 1 via ORAL
  Filled 2018-10-22: qty 1

## 2018-10-22 MED ORDER — OLANZAPINE 2.5 MG PO TABS
1.2500 mg | ORAL_TABLET | Freq: Every day | ORAL | Status: DC
  Administered 2018-10-22: 1.25 mg via ORAL
  Filled 2018-10-22: qty 0.5

## 2018-10-22 MED ORDER — MAGNESIUM CITRATE PO SOLN: 0.5000 | Freq: Every day | ORAL | 0 refills | Status: DC | PRN

## 2018-10-22 NOTE — Progress Notes (Signed)
EMS picked up patient.

## 2018-10-22 NOTE — Discharge Summary (Signed)
[facility] - Deputy at [facility]   PATIENT NAME: Erica Morton    MR#:  [id number]  DATE OF BIRTH:  Aug 15, 1946  DATE OF ADMISSION:  10/20/2018 ADMITTING PHYSICIAN: Oralia Manis, MD  DATE OF DISCHARGE: 10/22/2018   PRIMARY CARE PHYSICIAN: [facility]    ADMISSION DIAGNOSIS:  Dehydration [E86.0] Hypotension, unspecified hypotension type [I95.9] Altered mental status, unspecified altered mental status type [R41.82]  DISCHARGE DIAGNOSIS:  Principal Problem:   Hypotension Active Problems:   Diabetes mellitus type 2, controlled (HCC)   Hypothyroidism   Hyperlipidemia   Mixed Alzheimer's and vascular dementia (HCC)   SECONDARY DIAGNOSIS:   Past Medical History:  Diagnosis Date  . Dementia (HCC)   . Depression   . Hyperlipidemia   . Hypertension   . Memory deficit   . Thyroid disease     HOSPITAL COURSE:   * Hypotension - some element of dehydration, her family states that she almost never drinks much in terms of p.o. intake. May have some orthostasis when she stood up and that contributed to her falls.  hydrate her with IV fluids , her blood pressure has already improved with initial fluids.  *Altered mental status-unclear etiology upfront, work-up in the ED was initially within normal limits. UA and Xray chest negative MRI brain negative for acute changes. Patient still continue to stay significantly drowsy today.  Part of it could be due to her staying up until late last night as presented to emergency room, but she is not arousable to painful stimuli also so I will call neurology consult to decide if she need EEG or not. No changes as per neurologist as patient is awake and back to baseline now.  Advised to continue the same medications as that is set up by her outpatient physician and psychiatrist.  *Pain in pelvic region As per daughter since fall patient was showing signs of pain in her lower back or pelvic area,  x-ray pelvis was negative for any fracture but during my exam patient was also showing signs of pain on movement of her lower extremities so I had done CT scan of pelvis which showed nondisplaced fracture of sacrum. Orthopedic consult with Dr. Rosita Kea to decide further management.  As per nurse patient was able to get up and off the bed after the CT scan when she was more awake. Per Dr. Trilby Drummer, acute cystitis.  As patient does not have significant pain and she is able to ambulate, no surgical interventions at this time.  He suggested to get physical therapist involved. As patient has terminal dementia and she is nonverbal and very difficult to get her to follow instructions or commands, getting physical therapy involved would not help much.  She would likely keep doing what comes up to her mind at her facility and would not even follow the instructions to use a walker or any kind of support because of her terminal dementia.  I had discussed this concern with her daughter and she presented again understands the situation and agrees with the concern. After discussing with the daughter we had come to conclusion that in her situation the best thing is to let her go back to the facility and let her move around as she wish and give her Tylenol as scheduled for next few days as she would be hurting but would not ask for the pain medicines.  Due to her terminal dementia she may feel much comfortable in her familiar environment at memory care unit  so we will discharge her today.  *Diabetes mellitus type 2, controlled (HCC) -patient is not on anti-glycemic's, monitor her glucose levels and treat as needed  *Mixed Alzheimer's and vascular dementia (HCC) -continue home medications *Hypothyroidism -continue home dose thyroid replacement- TSH normal. *Hyperlipidemia -continue home dose antilipid   DISCHARGE CONDITIONS:   [place].  CONSULTS OBTAINED:    DRUG ALLERGIES:   Allergies  Allergen Reactions   . Metformin Diarrhea    DISCHARGE MEDICATIONS:   Allergies as of 10/22/2018      Reactions   Metformin Diarrhea      Medication List    STOP taking these medications   traZODone 25 mg Tabs tablet Commonly known as:  DESYREL     TAKE these medications   acetaminophen 325 MG tablet Commonly known as:  TYLENOL Take 2 tablets (650 mg total) by mouth 3 (three) times daily. What changed:    when to take this  reasons to take this   Docusate Sodium 150 MG/15ML syrup Take 100 mg by mouth 2 (two) times daily.   escitalopram 5 MG tablet Commonly known as:  LEXAPRO Take 5 mg by mouth daily.   levothyroxine 100 MCG tablet Commonly known as:  SYNTHROID, LEVOTHROID Take 100 mcg by mouth daily.   magnesium citrate Soln Take 148 mLs (0.5 Bottles total) by mouth daily as needed for severe constipation.   Melatonin 3 MG Tabs Take 6 mg by mouth at bedtime.   meloxicam 7.5 MG tablet Commonly known as:  MOBIC Take 7.5 mg by mouth daily.   mirtazapine 30 MG tablet Commonly known as:  REMERON Take 30 mg by mouth at bedtime.   OLANZapine 2.5 MG tablet Commonly known as:  ZYPREXA Take 2.5 mg by mouth at bedtime.   OLANZapine 2.5 MG tablet Commonly known as:  ZYPREXA Take 1.25 mg by mouth daily.   rivastigmine 1.5 MG capsule Commonly known as:  EXELON Take 1.5 mg by mouth daily.   vitamin C 1000 MG tablet Take 1,000 mg by mouth daily.   Vitamin D 50 MCG (2000 UT) tablet Take 2,000 Units by mouth daily.        DISCHARGE INSTRUCTIONS:   Follow with primary care physician in 1 to 2 weeks.  If you experience worsening of your admission symptoms, develop shortness of breath, life threatening emergency, suicidal or homicidal thoughts you must seek medical attention immediately by calling 911 or calling your MD immediately  if symptoms less severe.  You Must read complete instructions/literature along with all the possible adverse reactions/side effects for all the  Medicines you take and that have been prescribed to you. Take any new Medicines after you have completely understood and accept all the possible adverse reactions/side effects.   Please note  You were cared for by a hospitalist during your hospital stay. If you have any questions about your discharge medications or the care you received while you were in the hospital after you are discharged, you can call the unit and asked to speak with the hospitalist on call if the hospitalist that took care of you is not available. Once you are discharged, your primary care physician will handle any further medical issues. Please note that NO REFILLS for any discharge medications will be authorized once you are discharged, as it is imperative that you return to your primary care physician (or establish a relationship with a primary care physician if you do not have one) for your aftercare needs so that they can reassess  your need for medications and monitor your lab values.    Today   CHIEF COMPLAINT:   Chief Complaint  Patient presents with  . Urinary Tract Infection  . Weakness    HISTORY OF PRESENT ILLNESS:  Erica Morton  is a 73 y.o. female presents with chief complaint as above.  Patient presents from memory care facility after having 2 falls and being significantly more lethargic than normal.  Patient is unable to contribute to her HPI due to dementia and her acute condition.  Her family members at bedside and provides a history.  She states that the patient is normally confused, but very active.  She typically is up and walking around frequently despite not being oriented.  Today she has fallen twice, which is atypical for her except when she gets a UTI.  And she has subsequently been very lethargic and sleeping, which is very atypical for her.  Evaluation here in the ED shows evidence for UTI on urinalysis.  Patient is persistently lethargic.  She did have 2 low blood pressure readings early on  during her stay in the ED.  She got IV fluids and her blood pressure improved, though it has been somewhat labile.  Hospitalist were called for admission and further evaluation   VITAL SIGNS:  Blood pressure (!) 157/96, pulse 79, temperature 97.7 F (36.5 C), temperature source Oral, resp. rate 20, height 5\' 2"  (1.575 m), weight 59.4 kg, SpO2 97 %.  I/O:  No intake or output data in the 24 hours ending 10/22/18 1316  PHYSICAL EXAMINATION:   GENERAL:  73 y.o.-year-old patient lying in the bed with no acute distress.  EYES: Pupils equal, round, reactive to light . No scleral icterus. Extraocular muscles intact.  HEENT: Head atraumatic, normocephalic. Oropharynx and nasopharynx clear.  NECK:  Supple, no jugular venous distention. No thyroid enlargement, no tenderness.  LUNGS: Normal breath sounds bilaterally, no wheezing, rales,rhonchi or crepitation. No use of accessory muscles of respiration.  CARDIOVASCULAR: S1, S2 normal. No murmurs, rubs, or gallops.  ABDOMEN: Soft, nontender, nondistended. Bowel sounds present. No organomegaly or mass.  EXTREMITIES: No pedal edema, cyanosis, or clubbing.  NEUROLOGIC:  Patient is alert, nonverbal, does not follow command, moves limbs spontaneously.` PSYCHIATRIC: The patient is  alert and not oriented.Marland Kitchen  SKIN: No obvious rash, lesion, or ulcer.    DATA REVIEW:   CBC Recent Labs  Lab 10/20/18 1824  WBC 9.2  HGB 14.6  HCT 42.6  PLT 242    Chemistries  Recent Labs  Lab 10/20/18 1824  NA 138  K 4.1  CL 103  CO2 27  GLUCOSE 144*  BUN 19  CREATININE 0.62  CALCIUM 9.6    Cardiac Enzymes Recent Labs  Lab 10/20/18 1824  TROPONINI <0.03    Microbiology Results  Results for orders placed or performed during the hospital encounter of 10/20/18  Urine culture     Status: None   Collection Time: 10/20/18  9:47 PM  Result Value Ref Range Status   Specimen Description URINE, CATHETERIZED  Final   Special Requests   Final     NONE Performed at [facility], [address] [place], [place] [address]    Culture NO GROWTH  Final   Report Status 10/22/2018 FINAL  Final  MRSA PCR Screening     Status: None   Collection Time: 10/21/18  1:04 AM  Result Value Ref Range Status   MRSA by PCR NEGATIVE NEGATIVE Final    Comment:  The GeneXpert MRSA Assay (FDA approved for NASAL specimens only), is one component of a comprehensive MRSA colonization surveillance program. It is not intended to diagnose MRSA infection nor to guide or monitor treatment for MRSA infections. Performed at [facility], [address] [place], [place] [address]     RADIOLOGY:  Dg Chest 1 View  Result Date: 10/21/2018 CLINICAL DATA:  73 year old female with a history of dementia EXAM: CHEST  1 VIEW COMPARISON:  None. FINDINGS: The heart size and mediastinal contours are within normal limits. Both lungs are clear. The visualized skeletal structures are unremarkable. IMPRESSION: No active disease. Electronically Signed   By: Gilmer MorJaime  Wagner D.O.   On: 10/21/2018 12:48   Dg Lumbar Spine 2-3 Views  Result Date: 10/20/2018 CLINICAL DATA:  Fall with low back pain EXAM: LUMBAR SPINE - 2-3 VIEW COMPARISON:  None. FINDINGS: Lumbar alignment is within normal limits. Vertebral body heights are maintained. Mild degenerative changes at L1-L2, L2-L3 and L3-L4. IMPRESSION: Mild degenerative changes.  No acute osseous abnormality Electronically Signed   By: Jasmine PangKim  Fujinaga M.D.   On: 10/20/2018 21:50   Dg Pelvis 1-2 Views  Result Date: 10/20/2018 CLINICAL DATA:  Fall with back pain EXAM: PELVIS - 1-2 VIEW COMPARISON:  None. FINDINGS: SI joints are non widened. Moderate retained feces at the rectum. Pubic symphysis and rami appear intact. No fracture or malalignment. Mild degenerative changes of both hips. IMPRESSION: No acute osseous abnormality. Electronically Signed   By: Jasmine PangKim  Fujinaga M.D.   On: 10/20/2018 21:49   Ct Head Wo  Contrast  Result Date: 10/20/2018 CLINICAL DATA:  Altered mental status EXAM: CT HEAD WITHOUT CONTRAST TECHNIQUE: Contiguous axial images were obtained from the base of the skull through the vertex without intravenous contrast. COMPARISON:  None. FINDINGS: Brain: There is no mass, hemorrhage or extra-axial collection. There is generalized atrophy without lobar predilection. Hypodensity of the white matter is most commonly associated with chronic microvascular disease. Vascular: No abnormal hyperdensity of the major intracranial arteries or dural venous sinuses. No intracranial atherosclerosis. Skull: The visualized skull base, calvarium and extracranial soft tissues are normal. Sinuses/Orbits: No fluid levels or advanced mucosal thickening of the visualized paranasal sinuses. No mastoid or middle ear effusion. The orbits are normal. IMPRESSION: Chronic small vessel disease without acute intracranial abnormality. Electronically Signed   By: Deatra RobinsonKevin  Herman M.D.   On: 10/20/2018 23:06   Ct Pelvis Wo Contrast  Result Date: 10/21/2018 CLINICAL DATA:  Recent false with posterior pelvic/sacral pain. Dementia. EXAM: CT PELVIS WITHOUT CONTRAST TECHNIQUE: Multidetector CT imaging of the pelvis was performed following the standard protocol without intravenous contrast. COMPARISON:  Radiographs 10/20/2018 FINDINGS: Musculoskeletal: Transverse nondisplaced sacral fracture at the junction of the sacrum and the coccyx as shown on image 83/5. There is a sclerotic band at this level with some central lucency and mild anterior cortical irregularity. We also demonstrate presacral edema corresponding to this location. No well-defined fracture of the sacral ala noted. No other pelvic fracture is identified on today's CT examination. Moderate degenerative hip arthropathy bilaterally with loss of articular space and spurring. There is also spurring of the pubis. No significant musculotendinous findings. Mild left foraminal  impingement at L5-S1 due to facet spurring. Vascular: Aortoiliac atherosclerotic vascular disease. Gastrointestinal: Prominence of stool in the distal colon. Considerable prominence of stool in the rectal vault, with slight stranding along the margins of the rectum such that stercoral colitis could not be totally excluded. Urinary tract: Urinary bladder unremarkable. IMPRESSION: 1. Nondisplaced  transverse fracture at the sacrococcygeal junction, with mild associated presacral edema. No visible fractures through the sacral ala or elsewhere in the pelvis. 2. Mild left foraminal impingement at L5-S1 due to facet spurring. 3. There is prominence of stool in the distal colon and most notably in the rectum, along with mild stranding along the rectal wall-low-grade stercoral colitis could not be readily excluded. 4.  Aortic Atherosclerosis (ICD10-I70.0). Electronically Signed   By: Gaylyn Rong M.D.   On: 10/21/2018 13:28   Mr Brain Wo Contrast  Result Date: 10/21/2018 CLINICAL DATA:  Decrease in mental status with falls. EXAM: MRI HEAD WITHOUT CONTRAST TECHNIQUE: Multiplanar, multiecho pulse sequences of the brain and surrounding structures were obtained without intravenous contrast. COMPARISON:  Head CT from yesterday.  Brain MRI 03/19/2015 FINDINGS: Altered mental status with severely motion degraded study that was truncated. No persistent diffusion signal to suggest acute infarct. There is no hydrocephalus, gross collection, or masslike finding. Cerebral volume loss that has progressed from prior MRI, especially the medial temporal lobes in this patient with history of Alzheimer's dementia. On gradient imaging there is question of superficial siderosis along the bilateral frontal lobes. No acute blood products on prior head CT. IMPRESSION: Very motion degraded and truncated study without acute finding, as above. Electronically Signed   By: Marnee Spring M.D.   On: 10/21/2018 07:09    EKG:   Orders  placed or performed during the hospital encounter of 10/20/18  . ED EKG  . ED EKG      Management plans discussed with the patient, family and they are in agreement.  CODE STATUS: Full.    Code Status Orders  (From admission, onward)         Start     Ordered   10/21/18 0100  Full code  Continuous     10/21/18 0059        Code Status History    This patient has a current code status but no historical code status.    Advance Directive Documentation     Most Recent Value  Type of Advance Directive  Healthcare Power of Attorney  Pre-existing out of facility DNR order (yellow form or pink MOST form)  -  "MOST" Form in Place?  -      TOTAL TIME TAKING CARE OF THIS PATIENT: 35 minutes.    Altamese Dilling M.D on 10/22/2018 at 1:16 PM  Between 7am to 6pm - Pager - [phone]  After 6pm go to [email or web address] - Social research officer, government  [facility] [place]  Office  [phone]  CC: Primary care physician; [facility]   Note: This dictation was prepared with Dragon dictation along with smaller phrase technology. Any transcriptional errors that result from this process are unintentional.

## 2018-10-22 NOTE — Progress Notes (Signed)
Patient is being discharged back to [facility] this afternoon. EMS called. Tech prepared pt for transport. Daughter bedside. IV removed. AVS printed

## 2018-10-22 NOTE — Consult Note (Signed)
Patient is seen for evaluation of a sacrococcygeal fracture.  Her CT was reviewed.  Her daughter is with her she reports that she suffered a fall at her memory care unit.  Patient is minimally verbal secondary to her dementia.  She has been moving a little better today her daughter reports that she is able to get to the bathroom.  She normally walks with a walker.  On exam she is turns in bed without any grimacing or external evidence of pain.  She is nontender over the sacrum and tender at the sacrococcyx junction.  There is does not appear to be a neuro deficit in the lower extremities.  Impression is sacrococcygeal fracture  Recommendation is nonoperative management with main considerations pain control and constipation control.  Additionally will order physical therapy to get her back on her walker.

## 2018-10-22 NOTE — NC FL2 (Addendum)
[place] MEDICAID FL2 LEVEL OF CARE SCREENING TOOL     IDENTIFICATION  Patient Name: Erica Morton Birthdate: 07/01/1946 Sex: female Admission Date (Current Location): 10/20/2018  [place] and [place] Number:  Chiropodist and Address:  [facility], [address], [place], [place] [address]      Provider Number: [id number]  Attending Physician Name and Address:  Altamese Dilling, *  Relative Name and Phone Number:  Milinda Hirschfeld Daughter [phone]      Current Level of Care: Hospital Recommended Level of Care: Memory Care Prior Approval Number:    Date Approved/Denied:   PASRR Number:    Discharge Plan: Other (Comment)(Memory care [facility] )    Current Diagnoses: Patient Active Problem List   Diagnosis Date Noted  . Hypotension 10/20/2018  . Palliative care encounter 10/10/2018  . Memory deficits 10/10/2018  . Dysphagia 10/10/2018  . Diabetes mellitus type 2, controlled (HCC) 12/12/2017  . HTN (hypertension) 12/12/2017  . Hypothyroidism 12/12/2017  . Hyperlipidemia 12/12/2017  . Vitamin D deficiency 12/12/2017  . Gait instability 12/12/2017  . Mixed Alzheimer's and vascular dementia (HCC) 12/12/2017  . Chronic neck pain 10/04/2016  . Depression 09/30/2014    Orientation RESPIRATION BLADDER Height & Weight     Self Normal(Room air) Incontinent Weight: 130 lb 14.4 oz (59.4 kg) Height:  5\' 2"  (157.5 cm)  BEHAVIORAL SYMPTOMS/MOOD NEUROLOGICAL BOWEL NUTRITION STATUS      Continent Diet(Heart Healthy Diet )  AMBULATORY STATUS COMMUNICATION OF NEEDS Skin   Supervision Non-Verbally Normal                       Personal Care Assistance Level of Assistance  Bathing, Feeding, Dressing Bathing Assistance: Independent Feeding assistance: Independent Dressing Assistance: Limited assistance     Functional Limitations Info  Sight, Hearing, Speech Sight Info: Adequate Hearing Info: Adequate Speech Info: Impaired     SPECIAL CARE FACTORS FREQUENCY                      Contractures Contractures Info: Not present    Additional Factors Info  Allergies, Code Status Code Status Info: Full Code Allergies Info: Metformin           Current Medications (10/22/2018):  This is the current hospital active medication list Current Facility-Administered Medications  Medication Dose Route Frequency Provider Last Rate Last Dose  . acetaminophen (TYLENOL) tablet 650 mg  650 mg Oral Q6H PRN Oralia Manis, MD   650 mg at 10/21/18 1752   Or  . acetaminophen (TYLENOL) suppository 650 mg  650 mg Rectal Q6H PRN Oralia Manis, MD      . enoxaparin (LOVENOX) injection 40 mg  40 mg Subcutaneous Q24H Oralia Manis, MD   40 mg at 10/21/18 2110  . levothyroxine (SYNTHROID, LEVOTHROID) tablet 100 mcg  100 mcg Oral QAC breakfast Altamese Dilling, MD      . mirtazapine (REMERON) tablet 30 mg  30 mg Oral QHS Rejeana Brock, MD   30 mg at 10/21/18 2110  . OLANZapine (ZYPREXA) tablet 1.25 mg  1.25 mg Oral Daily Altamese Dilling, MD   1.25 mg at 10/22/18 0919  . ondansetron (ZOFRAN) tablet 4 mg  4 mg Oral Q6H PRN Oralia Manis, MD       Or  . ondansetron [facility]) injection 4 mg  4 mg Intravenous Q6H PRN Oralia Manis, MD      . polyethylene glycol (MIRALAX / GLYCOLAX) packet 17 g  17 g Oral Daily Altamese DillingVachhani, Vaibhavkumar, MD      . rivastigmine (EXELON) capsule 1.5 mg  1.5 mg Oral Daily Rejeana BrockKirkpatrick, McNeill P, MD   1.5 mg at 10/22/18 0919  . senna-docusate (Senokot-S) tablet 1 tablet  1 tablet Oral BID Altamese DillingVachhani, Vaibhavkumar, MD   1 tablet at 10/22/18 0919  . sodium chloride flush (NS) 0.9 % injection 3 mL  3 mL Intravenous Once Sharman CheekStafford, Phillip, MD         Discharge Medications:  Medication List    STOP taking these medications   traZODone 25 mg Tabs tablet Commonly known as:  DESYREL     TAKE these medications   acetaminophen 325 MG tablet Commonly known as:  TYLENOL Take 2 tablets  (650 mg total) by mouth 3 (three) times daily. What changed:    when to take this  reasons to take this   Docusate Sodium 150 MG/15ML syrup Take 100 mg by mouth 2 (two) times daily.   escitalopram 5 MG tablet Commonly known as:  LEXAPRO Take 5 mg by mouth daily.   levothyroxine 100 MCG tablet Commonly known as:  SYNTHROID, LEVOTHROID Take 100 mcg by mouth daily.   magnesium citrate Soln Take 148 mLs (0.5 Bottles total) by mouth daily as needed for severe constipation.   Melatonin 3 MG Tabs Take 6 mg by mouth at bedtime.   meloxicam 7.5 MG tablet Commonly known as:  MOBIC Take 7.5 mg by mouth daily.   mirtazapine 30 MG tablet Commonly known as:  REMERON Take 30 mg by mouth at bedtime.   OLANZapine 2.5 MG tablet Commonly known as:  ZYPREXA Take 2.5 mg by mouth at bedtime.   OLANZapine 2.5 MG tablet Commonly known as:  ZYPREXA Take 1.25 mg by mouth daily.   rivastigmine 1.5 MG capsule Commonly known as:  EXELON Take 1.5 mg by mouth daily.   vitamin C 1000 MG tablet Take 1,000 mg by mouth daily.   Vitamin D 50 MCG (2000 UT) tablet Take 2,000 Units by mouth daily.     REVIEW OF SYSTEMS:    Relevant Imaging Results:  Relevant Lab Results:   Additional Information SSN: [id number]  Larwance RoteMaritza I [place], LCSW

## 2018-10-22 NOTE — Clinical Social Work Note (Addendum)
Clinical Social Work Assessment  Patient Details  Name: Erica Morton MRN: [id number] Date of Birth: [birth year]  Date of referral:  10/22/18               Reason for consult:  Care Management Concerns, Facility Placement                Permission sought to share information with:  Case Manager, Guardian, Magazine features editoracility Contact Representative Permission granted to share information::  Yes, Verbal Permission Granted  Name::     Erica Morton,Erica Morton [phone]   Agency::  [facility]   Relationship::  Gar PontoGuardian, Morton,Erica Morton [phone]   Contact Information:  Erica Morton,Erica Morton [phone]   Housing/Transportation Living arrangements for the past 2 months:  Assisted Living Facility([facility]) Source of Information:  Facility, Guardian, Adult Children Patient Interpreter Needed:  None Criminal Activity/Legal Involvement Pertinent to Current Situation/Hospitalization:  No - Comment as needed Significant Relationships:  Adult Children Lives with:    Do you feel safe going back to the place where you live?  Yes Need for family participation in patient care:  Yes (Comment)(Dementia)  Care giving concerns:   [facility]  Social Worker assessment / plan: CSW consulted with patient's legal guardian, Erica HirschfeldGina Morton, Morton who reported that patient would like her mother to return to [facility].  CSW [facility] care with [facility] who reported that patient is able to return [facility] when medically cleared. Patient is on room air, ambulatory w/minimal assisstance and needs assistance with ADL's.  Pt has severe cognitive deficit and is non verbal due to dementia.   Plan: [facility] w[facility]   Employment status:  Disabled (Comment on whether or not currently receiving Disability) Insurance information:  Medicare PT Recommendations:  Not assessed at this time Information / Referral to  community resources:     Patient/Family's Response to care:  Patient's and family is in agreement for patient to be [facility].    Patient/Family's Understanding of and Emotional Response to Diagnosis, Current Treatment, and Prognosis:  Pt and family demonstrated good understanding with diagnosis and discharge plan.   Emotional Assessment Appearance:  Appears older than stated age Attitude/Demeanor/Rapport:  Other(calm) Affect (typically observed):  Calm Orientation:  Fluctuating Orientation (Suspected and/or reported Sundowners) Alcohol / Substance use:  Not Applicable Psych involvement (Current and /or in the community):  No (Comment)  Discharge Needs  Concerns to be addressed:  Discharge Planning Concerns, Cognitive Concerns, Care Coordination Readmission within the last 30 days:  No Current discharge risk:  Cognitively Impaired Barriers to Discharge:  Continued Medical Work up   Darden RestaurantsMaritza I Ermine Stebbins, LCSW 10/22/2018, 10:03 AM

## 2018-10-22 NOTE — Plan of Care (Signed)
  Problem: Safety: Goal: Ability to remain free from injury will improve Outcome: Progressing   Problem: Nutrition: Goal: Adequate nutrition will be maintained Outcome: Progressing   Problem: Skin Integrity: Goal: Risk for impaired skin integrity will decrease Outcome: Progressing   

## 2018-10-22 NOTE — Clinical Social Work Note (Signed)
The patient will discharge today to [facility]. The patient's daughter and the facility are aware and in agreement. The CSW has faxed all documentation needed for discharge and has delivered the discharge packet to the chart. The CSW is signing off. Please consult should needs arise.  Argentina Ponder, MSW, Theresia Majors [phone]

## 2018-10-22 NOTE — Progress Notes (Signed)
Subjective: No significant events overnight  Exam: Vitals:   10/21/18 2238 10/22/18 0824  BP: 133/84 (!) 157/96  Pulse: 89 79  Resp: 20   Temp: 98.4 F (36.9 C) 97.7 F (36.5 C)  SpO2: 99% 97%   Gen: In bed, NAD Resp: non-labored breathing, no acute distress Abd: soft, nt  Neuro: MS: Awakens to voice, answers "no" when asked if she is in pain, follows some simple commands CN: Does not cooperate with visual field testing, pupils are equal and reactive bilaterally, she crosses midline in both directions Motor: Mildly increased tone in the right arm consistent with mild parkinsonism, she moves all extremities voluntarily Sensory: Responds to stimulation bilaterally  Impression: 73 year old female with advanced dementia, likely Lewy body disease, who presents with falls and difficulty walking.  I suspect that her difficulty walking is due to pain associated with her tailbone fracture, with constipation possibly contributing to a mild encephalopathy/increased falls.  At this point I would recommend bowel regimen and continued management of her coccyx per orthopedics and internal medicine.  Recommendations: 1) no further recommendations at this time, please call with further questions or concerns.  Ritta Slot, MD [facility] [phone]  If 7pm- 7am, please page neurology on call as listed in AMION.

## 2019-02-23 ENCOUNTER — Non-Acute Institutional Stay: Payer: Medicare Other | Admitting: Student

## 2019-02-23 ENCOUNTER — Other Ambulatory Visit: Payer: Self-pay

## 2019-02-23 VITALS — BP 100/60 | HR 72 | Resp 16

## 2019-02-23 DIAGNOSIS — Z515 Encounter for palliative care: Secondary | ICD-10-CM

## 2019-02-23 NOTE — Progress Notes (Signed)
[facility], [facility] Consult Note Telephone: [phone]  Fax: [phone]  PATIENT NAME: Erica Morton Tullis DOB: [birth year] MRN: [id number]  PRIMARY CARE PROVIDER:   [facility]  REFERRING PROVIDER:  [facility] [address] [place], [place] [address]  RESPONSIBLE PARTY: Daughter, Milinda HirschfeldGina [name] [phone]  ASSESSMENT: Ms. Edmonia JamesCaviness is observed sitting in common area outside of her room. She opens her eyes to her name being called. NP explained reason for visit. She is cooperative with assessment; she does not engage much with conversation and speaks very few words. PAINAD-0. No acute distress noted. No anxiety or agitation observed during visit. Discussed with med tech Colin MuldersBrianna and facility nurse Beth. Will call daughter Almira CoasterGina to discuss ongoing goals of care.    01/06/2019 Albumin 4.1.    RECOMMENDATIONS and PLAN:  1. Medical goals of therapy: Palliative Medicine will continue to monitor for changes and declines; will make recommendations as needed. 2. Symptom management: Appetite: recommendation for nutritional supplement BID, staff encouraged to assist with meals, monitor weight and intake. Memory Loss secondary to dementia: continue with supportive measures as chronic disease is progressive 3. Discharge Planning: Ms. Edmonia JamesCaviness will [facility].  Palliative Medicine will follow up in 8 weeks or sooner, if needed.   I spent 15 minutes providing this consultation,  from 11:45am to 12:00pm. More than 50% of the time in this consultation was spent coordinating communication.   HISTORY OF PRESENT ILLNESS:  Erica Morton Habeeb is Morton 73 y.o.  female with multiple medical problems including mixed Alzheimer's and vascular dementia, type 2 diabetes, hypothryroidism, hyperlipidemia, hypertension, anxiety, chronic neck pain. Palliative Care was asked to help  address goals of care; she is seen for follow up visit today. Ms. Edmonia JamesCaviness requires assistance with all adl's. She ambulates ad lib about facility. Med tech Colin MuldersBrianna reports patient had some recent medication changes, where some medications were discontinued due to patient being more lethargic. Her zyprexa was discontinued 4/30. She states patient has been more alert since then. She was also treated for UTI 02/06/2019 with Bactrim DS BID x 7 days. Her appetite is fair. She does require direction/assistance with meals as she will get up during the middle of her meal. She is receiving Morton pureed diet. She had Morton fall on 01/04/2019. She has Morton bruise under right eye where staff suspects fall, but unsure as no fall was witnessed. No sleep difficulty reported. No reports of pain or dyspnea. Most recent weight is 119.8 pounds.   CODE STATUS: DNR   PPS: 40% HOSPICE ELIGIBILITY/DIAGNOSIS: TBD  PAST MEDICAL HISTORY:  Past Medical History:  Diagnosis Date  . Dementia (HCC)   . Depression   . Hyperlipidemia   . Hypertension   . Memory deficit   . Thyroid disease     SOCIAL HX:  Social History   Tobacco Use  . Smoking status: Never Smoker  . Smokeless tobacco: Never Used  Substance Use Topics  . Alcohol use: No    Frequency: Never    ALLERGIES:  Allergies  Allergen Reactions  . Metformin Diarrhea     PERTINENT MEDICATIONS:  Outpatient Encounter Medications as of 02/23/2019  Medication Sig  . acetaminophen (TYLENOL) 325 MG tablet Take 2 tablets (650 mg total) by mouth 3 (three) times daily. (Patient taking differently: Take 650 mg by mouth every 4 (four) hours as needed. )  . Ascorbic Acid (VITAMIN C) 1000 MG tablet  Take 1,000 mg by mouth daily.  . Cholecalciferol (VITAMIN D) 50 MCG (2000 UT) tablet Take 2,000 Units by mouth daily.  Tery Sanfilippo Sodium 150 MG/15ML syrup Take 100 mg by mouth 2 (two) times daily.  Marland Kitchen escitalopram (LEXAPRO) 5 MG tablet Take 5 mg by mouth daily.  Marland Kitchen levothyroxine  (SYNTHROID, LEVOTHROID) 100 MCG tablet Take 100 mcg by mouth daily.   . Melatonin 3 MG TABS Take 3 mg by mouth daily as needed.   . meloxicam (MOBIC) 7.5 MG tablet Take 7.5 mg by mouth daily.  . mirtazapine (REMERON) 30 MG tablet Take 30 mg by mouth at bedtime.  . rivastigmine (EXELON) 4.6 mg/24hr Place 4.6 mg onto the skin daily.  . magnesium citrate SOLN Take 148 mLs (0.5 Bottles total) by mouth daily as needed for severe constipation. (Patient not taking: Reported on 02/23/2019)  . [DISCONTINUED] OLANZapine (ZYPREXA) 2.5 MG tablet Take 2.5 mg by mouth at bedtime.  . [DISCONTINUED] OLANZapine (ZYPREXA) 2.5 MG tablet Take 1.25 mg by mouth daily.  . [DISCONTINUED] rivastigmine (EXELON) 1.5 MG capsule Take 1.5 mg by mouth daily.   No facility-administered encounter medications on file as of 02/23/2019.     PHYSICAL EXAM:   General: NAD, frail appearing, thin Cardiovascular: regular rate and rhythm Pulmonary: clear ant fields Abdomen: soft, nontender, + bowel sounds GU: no suprapubic tenderness Extremities: no edema, no joint deformities Skin: no rashes; purple bruising right cheek/lower eye lid Neurological: Weakness but otherwise nonfocal  Luella Cook, NP

## 2019-07-18 ENCOUNTER — Inpatient Hospital Stay
Admission: EM | Admit: 2019-07-18 | Discharge: 2019-07-23 | DRG: 083 | Disposition: A | Discharge status: Hospice | Payer: Medicare Other | Source: Skilled Nursing Facility | Attending: Internal Medicine | Admitting: Internal Medicine

## 2019-07-18 ENCOUNTER — Inpatient Hospital Stay: Payer: Medicare Other

## 2019-07-18 ENCOUNTER — Emergency Department: Payer: Medicare Other

## 2019-07-18 ENCOUNTER — Other Ambulatory Visit: Payer: Self-pay

## 2019-07-18 DIAGNOSIS — F0151 Vascular dementia with behavioral disturbance: Secondary | ICD-10-CM | POA: Diagnosis present

## 2019-07-18 DIAGNOSIS — F0281 Dementia in other diseases classified elsewhere with behavioral disturbance: Secondary | ICD-10-CM | POA: Diagnosis present

## 2019-07-18 DIAGNOSIS — Z79890 Hormone replacement therapy (postmenopausal): Secondary | ICD-10-CM | POA: Diagnosis not present

## 2019-07-18 DIAGNOSIS — S06369A Traumatic hemorrhage of cerebrum, unspecified, with loss of consciousness of unspecified duration, initial encounter: Secondary | ICD-10-CM

## 2019-07-18 DIAGNOSIS — Z8249 Family history of ischemic heart disease and other diseases of the circulatory system: Secondary | ICD-10-CM | POA: Diagnosis not present

## 2019-07-18 DIAGNOSIS — R627 Adult failure to thrive: Secondary | ICD-10-CM | POA: Diagnosis present

## 2019-07-18 DIAGNOSIS — E039 Hypothyroidism, unspecified: Secondary | ICD-10-CM | POA: Diagnosis present

## 2019-07-18 DIAGNOSIS — F419 Anxiety disorder, unspecified: Secondary | ICD-10-CM | POA: Diagnosis present

## 2019-07-18 DIAGNOSIS — R400 Somnolence: Secondary | ICD-10-CM

## 2019-07-18 DIAGNOSIS — R296 Repeated falls: Secondary | ICD-10-CM | POA: Diagnosis present

## 2019-07-18 DIAGNOSIS — Y92129 Unspecified place in nursing home as the place of occurrence of the external cause: Secondary | ICD-10-CM | POA: Diagnosis not present

## 2019-07-18 DIAGNOSIS — Z87440 Personal history of urinary (tract) infections: Secondary | ICD-10-CM

## 2019-07-18 DIAGNOSIS — I10 Essential (primary) hypertension: Secondary | ICD-10-CM | POA: Diagnosis present

## 2019-07-18 DIAGNOSIS — W19XXXA Unspecified fall, initial encounter: Secondary | ICD-10-CM | POA: Diagnosis present

## 2019-07-18 DIAGNOSIS — Z20828 Contact with and (suspected) exposure to other viral communicable diseases: Secondary | ICD-10-CM | POA: Diagnosis present

## 2019-07-18 DIAGNOSIS — Z66 Do not resuscitate: Secondary | ICD-10-CM | POA: Diagnosis not present

## 2019-07-18 DIAGNOSIS — Z515 Encounter for palliative care: Secondary | ICD-10-CM | POA: Diagnosis not present

## 2019-07-18 DIAGNOSIS — I609 Nontraumatic subarachnoid hemorrhage, unspecified: Secondary | ICD-10-CM | POA: Diagnosis not present

## 2019-07-18 DIAGNOSIS — E119 Type 2 diabetes mellitus without complications: Secondary | ICD-10-CM | POA: Diagnosis present

## 2019-07-18 DIAGNOSIS — S066X9A Traumatic subarachnoid hemorrhage with loss of consciousness of unspecified duration, initial encounter: Secondary | ICD-10-CM | POA: Diagnosis present

## 2019-07-18 DIAGNOSIS — Z79899 Other long term (current) drug therapy: Secondary | ICD-10-CM | POA: Diagnosis not present

## 2019-07-18 DIAGNOSIS — G309 Alzheimer's disease, unspecified: Secondary | ICD-10-CM | POA: Diagnosis present

## 2019-07-18 DIAGNOSIS — Z888 Allergy status to other drugs, medicaments and biological substances status: Secondary | ICD-10-CM | POA: Diagnosis not present

## 2019-07-18 DIAGNOSIS — F329 Major depressive disorder, single episode, unspecified: Secondary | ICD-10-CM | POA: Diagnosis present

## 2019-07-18 DIAGNOSIS — E559 Vitamin D deficiency, unspecified: Secondary | ICD-10-CM | POA: Diagnosis present

## 2019-07-18 DIAGNOSIS — E785 Hyperlipidemia, unspecified: Secondary | ICD-10-CM | POA: Diagnosis present

## 2019-07-18 HISTORY — DX: Anxiety disorder, unspecified: F41.9

## 2019-07-18 HISTORY — DX: Vitamin D deficiency, unspecified: E55.9

## 2019-07-18 LAB — URINALYSIS, ROUTINE W REFLEX MICROSCOPIC
Bilirubin Urine: NEGATIVE
Glucose, UA: NEGATIVE mg/dL
Hgb urine dipstick: NEGATIVE
Ketones, ur: 5 mg/dL — AB
Leukocytes,Ua: NEGATIVE
Nitrite: NEGATIVE
Protein, ur: NEGATIVE mg/dL
Specific Gravity, Urine: 1.017 (ref 1.005–1.030)
pH: 7 (ref 5.0–8.0)

## 2019-07-18 LAB — BASIC METABOLIC PANEL
Anion gap: 11 (ref 5–15)
BUN: 24 mg/dL — ABNORMAL HIGH (ref 8–23)
CO2: 25 mmol/L (ref 22–32)
Calcium: 9.6 mg/dL (ref 8.9–10.3)
Chloride: 104 mmol/L (ref 98–111)
Creatinine, Ser: 0.77 mg/dL (ref 0.44–1.00)
GFR calc Af Amer: >60 mL/min (ref >60)
GFR calc non Af Amer: >60 mL/min (ref >60)
Glucose, Bld: 142 mg/dL — ABNORMAL HIGH (ref 70–99)
Potassium: 4.6 mmol/L (ref 3.5–5.1)
Sodium: 140 mmol/L (ref 135–145)

## 2019-07-18 LAB — CBC
HCT: 42.4 % (ref 36.0–46.0)
Hemoglobin: 14.6 g/dL (ref 12.0–15.0)
MCH: 33 pg (ref 26.0–34.0)
MCHC: 34.4 g/dL (ref 30.0–36.0)
MCV: 95.9 fL (ref 80.0–100.0)
Platelets: 226 10*3/uL (ref 150–400)
RBC: 4.42 MIL/uL (ref 3.87–5.11)
RDW: 11.9 % (ref 11.5–15.5)
WBC: 10.3 10*3/uL (ref 4.0–10.5)
nRBC: 0 % (ref 0.0–0.2)

## 2019-07-18 MED ORDER — ACETAMINOPHEN 160 MG/5ML PO SOLN
650.0000 mg | ORAL | Status: DC | PRN
  Filled 2019-07-18: qty 20.3

## 2019-07-18 MED ORDER — LORAZEPAM 2 MG/ML IJ SOLN: 1.0000 mg | Freq: Once | INTRAMUSCULAR | Status: DC

## 2019-07-18 MED ORDER — STROKE: EARLY STAGES OF RECOVERY BOOK: Freq: Once | Status: DC

## 2019-07-18 MED ORDER — ACETAMINOPHEN 325 MG PO TABS: 650.0000 mg | ORAL_TABLET | ORAL | Status: DC | PRN

## 2019-07-18 MED ORDER — VITAMIN C 500 MG PO TABS: 1000.0000 mg | ORAL_TABLET | Freq: Every day | ORAL | Status: DC

## 2019-07-18 MED ORDER — RIVASTIGMINE 4.6 MG/24HR TD PT24
4.6000 mg | MEDICATED_PATCH | Freq: Every day | TRANSDERMAL | Status: DC
  Filled 2019-07-18 (×2): qty 1

## 2019-07-18 MED ORDER — SENNOSIDES-DOCUSATE SODIUM 8.6-50 MG PO TABS: 1.0000 | ORAL_TABLET | Freq: Two times a day (BID) | ORAL | Status: DC

## 2019-07-18 MED ORDER — LEVOTHYROXINE SODIUM 100 MCG PO TABS: 100.0000 ug | ORAL_TABLET | Freq: Every day | ORAL | Status: DC

## 2019-07-18 MED ORDER — BACITRACIN-NEOMYCIN-POLYMYXIN 400-5-5000 EX OINT
1.0000 "application " | TOPICAL_OINTMENT | Freq: Every day | CUTANEOUS | Status: DC
  Filled 2019-07-18: qty 1

## 2019-07-18 MED ORDER — ESCITALOPRAM OXALATE 10 MG PO TABS
10.0000 mg | ORAL_TABLET | Freq: Every day | ORAL | Status: DC
  Filled 2019-07-18: qty 1

## 2019-07-18 MED ORDER — INSULIN ASPART 100 UNIT/ML ~~LOC~~ SOLN: 0.0000 [IU] | Freq: Three times a day (TID) | SUBCUTANEOUS | Status: DC

## 2019-07-18 MED ORDER — MIRTAZAPINE 15 MG PO TABS: 15.0000 mg | ORAL_TABLET | Freq: Every day | ORAL | Status: DC

## 2019-07-18 MED ORDER — DOCUSATE SODIUM 50 MG/5ML PO LIQD
100.0000 mg | Freq: Two times a day (BID) | ORAL | Status: DC
  Filled 2019-07-18 (×2): qty 10

## 2019-07-18 MED ORDER — LORAZEPAM 2 MG/ML IJ SOLN
2.0000 mg | Freq: Once | INTRAMUSCULAR | Status: AC
  Administered 2019-07-18: 2 mg via INTRAVENOUS

## 2019-07-18 MED ORDER — OLANZAPINE 5 MG PO TABS: 5.0000 mg | ORAL_TABLET | Freq: Every evening | ORAL | Status: DC

## 2019-07-18 MED ORDER — INSULIN ASPART 100 UNIT/ML ~~LOC~~ SOLN: 0.0000 [IU] | Freq: Every day | SUBCUTANEOUS | Status: DC

## 2019-07-18 MED ORDER — PANTOPRAZOLE SODIUM 40 MG IV SOLR: 40.0000 mg | Freq: Every day | INTRAVENOUS | Status: DC

## 2019-07-18 MED ORDER — LABETALOL HCL 5 MG/ML IV SOLN
10.0000 mg | INTRAVENOUS | Status: DC | PRN
  Administered 2019-07-18: 10 mg via INTRAVENOUS

## 2019-07-18 MED ORDER — LABETALOL HCL 5 MG/ML IV SOLN
INTRAVENOUS | Status: AC
  Filled 2019-07-18: qty 4

## 2019-07-18 MED ORDER — LORAZEPAM 2 MG/ML IJ SOLN
INTRAMUSCULAR | Status: AC
  Filled 2019-07-18: qty 1

## 2019-07-18 MED ORDER — ACETAMINOPHEN 650 MG RE SUPP: 650.0000 mg | RECTAL | Status: DC | PRN

## 2019-07-18 MED ORDER — SODIUM CHLORIDE 0.9 % IV SOLN
INTRAVENOUS | Status: DC
  Administered 2019-07-18: 23:00:00 via INTRAVENOUS
  Administered 2019-07-19: 04:00:00 800 mL via INTRAVENOUS

## 2019-07-18 MED ORDER — MELOXICAM 7.5 MG PO TABS
7.5000 mg | ORAL_TABLET | Freq: Every day | ORAL | Status: DC
  Filled 2019-07-18: qty 1

## 2019-07-18 MED ORDER — VITAMIN D 25 MCG (1000 UNIT) PO TABS: 2000.0000 [IU] | ORAL_TABLET | Freq: Every day | ORAL | Status: DC

## 2019-07-18 MED ORDER — SODIUM CHLORIDE 0.9 % IV BOLUS
1000.0000 mL | Freq: Once | INTRAVENOUS | Status: AC
  Administered 2019-07-18: 23:00:00 1000 mL via INTRAVENOUS

## 2019-07-18 NOTE — ED Triage Notes (Signed)
Pt arrives [facility] from [facility] for 5 falls since Monday. 2 falls today. Only injury per EMS was from Monday, pt hit head. Has band-aid over wound above L eyebrow. Dementia hx. Not alert or oriented to anything. Per EMS this is pt baseline.

## 2019-07-18 NOTE — ED Notes (Signed)
Unable to complete swallow screen due to pt level of alertness.

## 2019-07-18 NOTE — ED Provider Notes (Addendum)
MRI results reviewed.  Discussed with hospitalist service (via [facility]), Marsa Aris has already placed consultation with neurosurgery upon review of MRI findings. [facility] consult placed to Dr. Alex Gardener, MD 07/18/19 7505    Delman Kitten, MD 07/18/19 2340

## 2019-07-18 NOTE — ED Notes (Signed)
Pt returned from CT °

## 2019-07-18 NOTE — ED Notes (Signed)
ED TO INPATIENT HANDOFF REPORT  ED Nurse Name and Phone #: Yetta Flock, [facility] Name/Age/Gender Erica Morton 73 y.o. female Room/Bed: ED05A/ED05A  Code Status   Code Status: Full Code  Home/SNF/Other Skilled nursing facility Patient oriented to: self, place, time and situation Is this baseline? Yes   Triage Complete: Triage complete  Chief Complaint Fall  Triage Note Pt [facility] from [facility] for 5 falls since Monday. 2 falls today. Only injury per EMS was from Monday, pt hit head. Has band-aid over wound above L eyebrow. Dementia hx. Not alert or oriented to anything. Per EMS this is pt baseline.    Allergies Allergies  Allergen Reactions  . Metformin Diarrhea    Level of Care/Admitting Diagnosis ED Disposition    ED Disposition Condition [facility]: [place] [100120]  Level of Care: Med-Surg [16]  Covid Evaluation: Asymptomatic Screening Protocol (No Symptoms)  Diagnosis: SAH (subarachnoid hemorrhage) [facility]) [188416]  Admitting Physician: Eula Flax  Attending Physician: Rufina Falco ACHIENG [phone]  Estimated length of stay: past midnight tomorrow  Certification:: I certify this patient will need inpatient services for at least 2 midnights  PT Class (Do Not Modify): Inpatient [101]  PT Acc Code (Do Not Modify): Private [1]       B Medical/Surgery History Past Medical History:  Diagnosis Date  . Anxiety   . Dementia ([place])   . Depression   . Hyperlipidemia   . Hypertension   . Memory deficit   . Thyroid disease   . Vitamin D deficiency    Past Surgical History:  Procedure Laterality Date  . ABDOMINAL HYSTERECTOMY       A IV Location/Drains/Wounds Patient Lines/Drains/Airways Status   Active Line/Drains/Airways    Name:   Placement date:   Placement time:   Site:   Days:   Peripheral IV 07/18/19 Right Forearm   07/18/19    1800    Forearm   less than 1           Intake/Output Last 24 hours No intake or output data in the 24 hours ending 07/18/19 2231  Labs/Imaging Results for orders placed or performed during the hospital encounter of 07/18/19 (from the past 48 hour(s))  Urinalysis, Routine w reflex microscopic     Status: Abnormal   Collection Time: 07/18/19  5:48 PM  Result Value Ref Range   Color, Urine YELLOW (A) YELLOW   APPearance HAZY (A) CLEAR   Specific Gravity, Urine 1.017 1.005 - 1.030   pH 7.0 5.0 - 8.0   Glucose, UA NEGATIVE NEGATIVE mg/dL   Hgb urine dipstick NEGATIVE NEGATIVE   Bilirubin Urine NEGATIVE NEGATIVE   Ketones, ur 5 (A) NEGATIVE mg/dL   Protein, ur NEGATIVE NEGATIVE mg/dL   Nitrite NEGATIVE NEGATIVE   Leukocytes,Ua NEGATIVE NEGATIVE    Comment: Performed at [facility], [address] [place], [place] [address]  CBC     Status: None   Collection Time: 07/18/19  5:48 PM  Result Value Ref Range   WBC 10.3 4.0 - 10.5 K/uL   RBC 4.42 3.87 - 5.11 MIL/uL   Hemoglobin 14.6 12.0 - 15.0 g/dL   HCT 42.4 36.0 - 46.0 %   MCV 95.9 80.0 - 100.0 fL   MCH 33.0 26.0 - 34.0 pg   MCHC 34.4 30.0 - 36.0 g/dL   RDW 11.9 11.5 - 15.5 %   Platelets 226 150 - 400 K/uL  nRBC 0.0 0.0 - 0.2 %    Comment: Performed at [facility], [address] [place], [place] [address]  Basic metabolic panel     Status: Abnormal   Collection Time: 07/18/19  5:48 PM  Result Value Ref Range   Sodium 140 135 - 145 mmol/L   Potassium 4.6 3.5 - 5.1 mmol/L   Chloride 104 98 - 111 mmol/L   CO2 25 22 - 32 mmol/L   Glucose, Bld 142 (H) 70 - 99 mg/dL   BUN 24 (H) 8 - 23 mg/dL   Creatinine, Ser 6.040.77 0.44 - 1.00 mg/dL   Calcium 9.6 8.9 - 54.010.3 mg/dL   GFR calc non Af Amer >60 >60 mL/min   GFR calc Af Amer >60 >60 mL/min   Anion gap 11 5 - 15    Comment: Performed at [facility], [address] [place], [place] [address]   Ct Head Wo Contrast  Addendum Date: 07/18/2019   ADDENDUM REPORT: 07/18/2019 18:38  ADDENDUM: Critical Value/emergent results were called by telephone at the time of interpretation on 07/18/2019 at 6:38 pm to providerMARK QUALE , who verbally acknowledged these results. Electronically Signed   By: Jasmine PangKim  Fujinaga M.D.   On: 07/18/2019 18:38   Result Date: 07/18/2019 CLINICAL DATA:  Altered mental status EXAM: CT HEAD WITHOUT CONTRAST TECHNIQUE: Contiguous axial images were obtained from the base of the skull through the vertex without intravenous contrast. COMPARISON:  CT brain 10/20/2018 FINDINGS: Brain: New hypodensity within the left frontal lobe since January 2020, appearance suggests subacute to developing chronic infarct. 8 mm focal hyperdensity at the left parietal temporal junction either representing a small focus of extra-axial/subarachnoid hemorrhage, though given slightly ovoid configuration, hemorrhagic mass is also possible. No midline shift or significant mass effect. Atrophy. Small vessel ischemic changes of the white matter. Stable ventricle size. Vascular: No hyperdense vessel.  Carotid vascular calcification. Skull: Normal. Negative for fracture or focal lesion. Sinuses/Orbits: No acute finding. Other: None IMPRESSION: 1. Focal 8 mm hyperdensity along the left convexity at the junction of the temporal and parietal lobes, favored to represent small amount of extra-axial hemorrhage/probable subarachnoid hemorrhage given history of multiple falls. Given ovoid configuration however small hemorrhagic mass is also considered in the differential. Evaluation with MRI may be considered. 2. Focal hypodensity in the left frontal lobe, new since January 2020, with an appearance suggesting subacute to developing chronic infarction. 3. Atrophy and small vessel ischemic changes of the white matter. Electronically Signed: By: Jasmine PangKim  Fujinaga M.D. On: 07/18/2019 18:31   Mr Brain Wo Contrast  Result Date: 07/18/2019 CLINICAL DATA:  Initial evaluation for subarachnoid hemorrhage, history of  multiple recent falls, altered mental status. EXAM: MRI HEAD WITHOUT CONTRAST TECHNIQUE: Multiplanar, multiecho pulse sequences of the brain and surrounding structures were obtained without intravenous contrast. COMPARISON:  Prior head CT from earlier the same day. FINDINGS: Brain: Diffuse prominence of the CSF containing spaces compatible with generalized age-related cerebral atrophy. Patchy and confluent T2/FLAIR hyperintensity within the periventricular deep white matter both cerebral hemispheres most consistent with chronic small vessel ischemic disease, mild to moderate in nature. Focal area of encephalomalacia and gliosis involving the left frontal operculum consistent with a chronic left MCA territory infarct (series 15, image 36). There is a 7 mm focus of mild diffusion abnormality involving the left frontal periventricular white matter, adjacent to the left frontal horn, consistent with a subacute small vessel type white matter infarct (series 5, image 28). No other evidence for  acute or subacute ischemia. Small focus of acute subarachnoid hemorrhage measuring approximately 1 cm seen at the posterior left temporal region (series 15, image 29). No underlying mass. 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 Findings are new relative to previous MRI. Additionally, focal area of encephalomalacia and gliosis involving the anterior right temporal horn consistent with a chronic cortical contusion, also new from previous MRI (series 15, image 17). Few additional scattered superimposed chronic micro hemorrhages noted involving both cerebral hemispheres, nonspecific, but likely hypertensive/small vessel related. No mass lesion, midline shift or mass effect. No hydrocephalus. No intraventricular hemorrhage. No extra-axial fluid collection.  Pituitary gland suprasellar region normal. Midline structures intact. Vascular: Major intracranial vascular flow voids are maintained. Skull and upper cervical spine: Craniocervical junction within normal limits. Upper cervical spine normal. Bone marrow signal intensity within normal limits. No acute abnormality about the scalp. Sinuses/Orbits: Globes and orbital soft tissues within normal limits. Paranasal sinuses are clear. No mastoid effusion. Inner ear structures grossly normal. Other: None. IMPRESSION: 1. Approximate 1 cm focus of acute subarachnoid hemorrhage involving the posterior left temporal region, corresponding with abnormality on prior CT. No underlying mass lesion. 2. 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 Finding is new relative to most recent available MRI from 2016. 3. 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 4. 7 mm focus of mild diffusion abnormality involving the left frontal periventricular white matter, consistent with subacute small vessel ischemic change. 5. 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 6. Underlying atrophy with mild to moderate chronic microvascular ischemic disease. Electronically Signed   By: Rise Mu M.D.   On: 07/18/2019 22:28    Pending Labs Unresulted Labs (From admission, onward)    Start     Ordered   07/19/19 0500  Basic metabolic panel  Tomorrow morning,   STAT     07/18/19 1939   07/19/19 0500  Protime-INR  Tomorrow morning,   STAT     07/18/19 1939   07/19/19 0500  APTT  Tomorrow morning,   STAT     07/18/19 1939   07/18/19 2154  SARS CORONAVIRUS 2 (TAT 6-24 HRS) Nasopharyngeal Nasopharyngeal Swab  (Asymptomatic/Tier 2 Patients Labs)  ONCE - STAT,   STAT    Question Answer  Comment  Is this test for diagnosis or screening Screening   Symptomatic for COVID-19 as defined by CDC No   Hospitalized for COVID-19 No   Admitted to ICU for COVID-19 No   Previously tested for COVID-19 No   Resident in a congregate (group) care setting No   Employed in healthcare setting No   Pregnant No      07/18/19 2153   07/18/19 1935  Hemoglobin A1c  Once,   STAT    Comments: To assess prior glycemic control    07/18/19 1939          Vitals/Pain Today's Vitals   07/18/19 1930 07/18/19 2015 07/18/19 2100 07/18/19 2221  BP: (!) 193/105 (!) 199/117 (!) 212/140 (!) 92/46  Pulse: 62 70 70 (!) 56  Resp: Temp:      TempSrc:      SpO2: 97% 100% 100% 95%  Weight:      Height:  Isolation Precautions No active isolations  Medications Medications  meloxicam (MOBIC) tablet 7.5 mg (has no administration in time range)  escitalopram (LEXAPRO) tablet 10 mg (has no administration in time range)  mirtazapine (REMERON) tablet 15 mg (has no administration in time range)  OLANZapine (ZYPREXA) tablet 5 mg (has no administration in time range)  rivastigmine (EXELON) 4.6 mg/24hr 4.6 mg (has no administration in time range)  levothyroxine (SYNTHROID) tablet 100 mcg (has no administration in time range)  docusate (COLACE) 50 MG/5ML liquid 100 mg (has no administration in time range)  vitamin C (ASCORBIC ACID) tablet 1,000 mg (has no administration in time range)  cholecalciferol (VITAMIN D3) tablet 2,000 Units (has no administration in time range)  neomycin-bacitracin-polymyxin (NEOSPORIN) ointment packet 1 application (has no administration in time range)   stroke: mapping our early stages of recovery book (has no administration in time range)  acetaminophen (TYLENOL) tablet 650 mg (has no administration in time range)    Or  acetaminophen (TYLENOL) solution 650 mg (has no administration in time range)    Or  acetaminophen (TYLENOL) suppository 650 mg (has no  administration in time range)  senna-docusate (Senokot-S) tablet 1 tablet (has no administration in time range)  pantoprazole (PROTONIX) injection 40 mg (has no administration in time range)  0.9 %  sodium chloride infusion (has no administration in time range)  insulin aspart (novoLOG) injection 0-9 Units (has no administration in time range)  insulin aspart (novoLOG) injection 0-5 Units (has no administration in time range)  labetalol (NORMODYNE) injection 10 mg (10 mg Intravenous Given 07/18/19 2120)  LORazepam (ATIVAN) 2 MG/ML injection (has no administration in time range)  labetalol (NORMODYNE) 5 MG/ML injection (has no administration in time range)  LORazepam (ATIVAN) injection 2 mg (2 mg Intravenous Given 07/18/19 2120)    Mobility walks High fall risk   Focused Assessments N/A   R Recommendations: See Admitting Provider Note  Report given to:   Additional Notes:

## 2019-07-18 NOTE — ED Notes (Signed)
Made Dr. Stark Klein aware of BP of 77/44.

## 2019-07-18 NOTE — ED Notes (Signed)
EDP at bedside  

## 2019-07-18 NOTE — ED Provider Notes (Signed)
[facility] Emergency Department Provider Note   ____________________________________________   First MD Initiated Contact with Patient 07/18/19 1723     (approximate)  I have reviewed the triage vital signs and the nursing notes.   HISTORY  Chief Complaint Fall  EM caveat: Dementia, difficulty with communication  HPI JULYANNA SCHOLLE is a 73 y.o. female who I spoke with her daughter who has had several falls over the last 5 days, she has been acting a little bit altered and her daughter reports she has had previous similar symptoms when she had issues like urinary tract infections but they are not certain as to cause.  She has had about 5 falls, 1 of which resulted in a small injury over the left eyelid   Past Medical History:  Diagnosis Date   Anxiety    Dementia ([place])    Depression    Hyperlipidemia    Hypertension    Memory deficit    Thyroid disease    Vitamin D deficiency     Patient Active Problem List   Diagnosis Date Noted   Hypotension 10/20/2018   Palliative care encounter 10/10/2018   Memory deficits 10/10/2018   Dysphagia 10/10/2018   Diabetes mellitus type 2, controlled ([place]) 12/12/2017   HTN (hypertension) 12/12/2017   Hypothyroidism 12/12/2017   Hyperlipidemia 12/12/2017   Vitamin D deficiency 12/12/2017   Gait instability 12/12/2017   Mixed Alzheimer's and vascular dementia ([place]) 12/12/2017   Chronic neck pain 10/04/2016   Depression 09/30/2014    Past Surgical History:  Procedure Laterality Date   ABDOMINAL HYSTERECTOMY      Prior to Admission medications   Medication Sig Start Date End Date Taking? Authorizing Provider  Ascorbic Acid (VITAMIN C) 1000 MG tablet Take 1,000 mg by mouth daily.   Yes [provider]  Cholecalciferol (VITAMIN D) 50 MCG (2000 UT) tablet Take 2,000 Units by mouth daily.   Yes [provider]  Docusate Sodium 150 MG/15ML syrup Take 100 mg by mouth  2 (two) times daily.   Yes [provider]  escitalopram (LEXAPRO) 10 MG tablet Take 10 mg by mouth daily.    Yes [provider]  levothyroxine (SYNTHROID, LEVOTHROID) 100 MCG tablet Take 100 mcg by mouth daily.    Yes [provider]  Melatonin 3 MG TABS Take 3 mg by mouth at bedtime as needed (sleep).    Yes [provider]  meloxicam (MOBIC) 7.5 MG tablet Take 7.5 mg by mouth daily.   Yes [provider]  mirtazapine (REMERON) 15 MG tablet Take 15 mg by mouth at bedtime.    Yes [provider]  neomycin-bacitracin-polymyxin (NEOSPORIN) ointment Apply 1 application topically daily. (apply to the left eyebrow and left knee)   Yes [provider]  NONFORMULARY OR COMPOUNDED ITEM Apply 1 application topically every 6 (six) hours as needed (anxiety). **LORAZEPAM 0.5mg /ml Topical Gel**   Yes [provider]  OLANZapine (ZYPREXA) 5 MG tablet Take 5 mg by mouth every evening.   Yes [provider]  rivastigmine (EXELON) 4.6 mg/24hr Place 4.6 mg onto the skin daily.   Yes [provider]  acetaminophen (TYLENOL) 325 MG tablet Take 2 tablets (650 mg total) by mouth 3 (three) times daily. Patient not taking: Reported on 07/18/2019 10/22/18   Vaughan Basta, MD  magnesium citrate SOLN Take 148 mLs (0.5 Bottles total) by mouth daily as needed for severe constipation. Patient not taking: Reported on 02/23/2019 10/22/18   Anselm Jungling,  Heath GoldVaibhavkumar, MD    Allergies Metformin  Family History  Problem Relation Age of Onset   Heart attack Mother    Non-Hodgkin's lymphoma Father    Breast cancer Neg Hx     Social History Social History   Tobacco Use   Smoking status: Never Smoker   Smokeless tobacco: Never Used  Substance Use Topics   Alcohol use: No    Frequency: Never   Drug use: No   Review of systems, EM caveat  ____________________________________________   PHYSICAL EXAM:  VITAL  SIGNS: ED Triage Vitals [07/18/19 1709]  Enc Vitals Group     BP (!) 201/114     Pulse Rate 76     Resp 16     Temp 97.9 F (36.6 C)     Temp Source Oral     SpO2 97 %     Weight 120 lb (54.4 kg)     Height 5\' 2"  (1.575 m)     Head Circumference      Peak Flow      Pain Score      Pain Loc      Pain Edu?      Excl. in GC?     Constitutional: Alert but speaks in nonsensical statements.  Verbalizes clear words, but they do not make sense.  Daughter reports is normal has been present for a long time suspected due to dementia or Lewy body. Eyes: Conjunctivae are normal. Head: Atraumatic except for a small healed over abrasion over the left eyebrow. Nose: No congestion/rhinnorhea. Mouth/Throat: Mucous membranes are moist. Neck: No stridor.  Cardiovascular: Normal rate, regular rhythm. Grossly normal heart sounds.  Good peripheral circulation. Respiratory: Normal respiratory effort.  No retractions. Lungs CTAB. Gastrointestinal: Soft and nontender. No distention. Musculoskeletal: No lower extremity tenderness nor edema.  Able to range her extremities without pain or discomfort through all long bones including the hips.  No focal tenderness over the hips or any long bones. Neurologic: Difficult to comprehend speech, does occasionally say words that are comprehendible but do not make sense.  No gross focal neurologic deficits are appreciated.  Skin:  Skin is warm, dry and intact. No rash noted. Psychiatric: Mood and affect are calm, clearly disoriented but appears to be similar though slightly [place] than baseline in discussion with her daughter  ____________________________________________   LABS (all labs ordered are listed, but only abnormal results are displayed)  Labs Reviewed  URINALYSIS, ROUTINE W REFLEX MICROSCOPIC - Abnormal; Notable for the following components:      Result Value   Color, Urine YELLOW (*)    APPearance HAZY (*)    Ketones, ur 5 (*)    All other  components within normal limits  BASIC METABOLIC PANEL - Abnormal; Notable for the following components:   Glucose, Bld 142 (*)    BUN 24 (*)    All other components within normal limits  CBC     RADIOLOGY  Ct Head Wo Contrast  Addendum Date: 07/18/2019   ADDENDUM REPORT: 07/18/2019 18:38 ADDENDUM: Critical Value/emergent results were called by telephone at the time of interpretation on 07/18/2019 at 6:38 pm to providerMARK Sheika Coutts , who verbally acknowledged these results. Electronically Signed   By: Jasmine PangKim  Fujinaga M.D.   On: 07/18/2019 18:38   Result Date: 07/18/2019 CLINICAL DATA:  Altered mental status EXAM: CT HEAD WITHOUT CONTRAST TECHNIQUE: Contiguous axial images were obtained from the base of the skull through the vertex without intravenous contrast. COMPARISON:  CT brain 10/20/2018  FINDINGS: Brain: New hypodensity within the left frontal lobe since January 2020, appearance suggests subacute to developing chronic infarct. 8 mm focal hyperdensity at the left parietal temporal junction either representing a small focus of extra-axial/subarachnoid hemorrhage, though given slightly ovoid configuration, hemorrhagic mass is also possible. No midline shift or significant mass effect. Atrophy. Small vessel ischemic changes of the white matter. Stable ventricle size. Vascular: No hyperdense vessel.  Carotid vascular calcification. Skull: Normal. Negative for fracture or focal lesion. Sinuses/Orbits: No acute finding. Other: None IMPRESSION: 1. Focal 8 mm hyperdensity along the left convexity at the junction of the temporal and parietal lobes, favored to represent small amount of extra-axial hemorrhage/probable subarachnoid hemorrhage given history of multiple falls. Given ovoid configuration however small hemorrhagic mass is also considered in the differential. Evaluation with MRI may be considered. 2. Focal hypodensity in the left frontal lobe, new since January 2020, with an appearance suggesting  subacute to developing chronic infarction. 3. Atrophy and small vessel ischemic changes of the white matter. Electronically Signed: By: Jasmine Pang M.D. On: 07/18/2019 18:31    CT head reviewed, discussed with radiologist.  Small amount of possible extra-axial hemorrhage  Also of concern possible inner ear will stroke ____________________________________________   PROCEDURES  Procedure(s) performed: None  Procedures  Critical Care performed: Yes, see critical care note(s)  CRITICAL CARE Performed by: Sharyn Creamer   Total critical care time: 30 minutes  Critical care time was exclusive of separately billable procedures and treating other patients.  Critical care was necessary to treat or prevent imminent or life-threatening deterioration.  Critical care was time spent personally by me on the following activities: development of treatment plan with patient and/or surrogate as well as nursing, discussions with consultants, evaluation of patient's response to treatment, examination of patient, obtaining history from patient or surrogate, ordering and performing treatments and interventions, ordering and review of laboratory studies, ordering and review of radiographic studies, pulse oximetry and re-evaluation of patient's condition.  Patient noted to have possible extra-axial hemorrhage.  Discussed with the patient's daughter at the bedside, who is HCPOA, and did not recognition of the patient's goals of care it does not appear that this patient would at all be in candidate for acute surgical intervention and is not on any anticoagulants.   ____________________________________________   INITIAL IMPRESSION / ASSESSMENT AND PLAN / ED COURSE  Pertinent labs & imaging results that were available during my care of the patient were reviewed by me and considered in my medical decision making (see chart for details).   Patient notes for altered mental status, multiple falls and there is  concern for possible interval stroke and also potentially a small hemorrhage.  In the setting of the patient's goals of care which are nonaggressive, and discussion with the patient's family she would certainly not be a surgical candidate regarding neurosurgery, and I discussed the case with Dr. Sheryle Hail of the hospitalist service and we will admit the patient for further care here.  She likely needs escalation in her level of care at her facility, I discussed with her daughter who is comfortable with this plan the patient will be admitted with further care planning and consultations anticipated      ____________________________________________   FINAL CLINICAL IMPRESSION(S) / ED DIAGNOSES  Final diagnoses:  Somnolence  Traumatic hemorrhage of cerebrum with loss of consciousness, unspecified laterality, initial encounter [facility])        Note:  This document was prepared using Dragon voice recognition software and may  include unintentional dictation errors       Sharyn Creamer, MD 07/18/19 1924

## 2019-07-18 NOTE — ED Notes (Signed)
Clothing removed and pt cleaned and new undergarment placed. Daughter at bedside. Pt is very strong and confused so she is not able to cooperate. Pt not attempting to get out of bed. BP remains high.

## 2019-07-18 NOTE — ED Notes (Signed)
Patient transported to MRI 

## 2019-07-18 NOTE — ED Notes (Signed)
Dr. Rufina Falco made aware of pt increasing trend in BP. Her last BP was 215/101.

## 2019-07-18 NOTE — ED Notes (Signed)
Pt brief was changed d/t being saturated. Pt cleaned and placed a chuck and new brief on pt. Pt then taken to CT.

## 2019-07-19 DIAGNOSIS — I609 Nontraumatic subarachnoid hemorrhage, unspecified: Secondary | ICD-10-CM

## 2019-07-19 LAB — BASIC METABOLIC PANEL
Anion gap: 8 (ref 5–15)
BUN: 20 mg/dL (ref 8–23)
CO2: 24 mmol/L (ref 22–32)
Calcium: 8.2 mg/dL — ABNORMAL LOW (ref 8.9–10.3)
Chloride: 110 mmol/L (ref 98–111)
Creatinine, Ser: 0.87 mg/dL (ref 0.44–1.00)
GFR calc Af Amer: >60 mL/min (ref >60)
GFR calc non Af Amer: >60 mL/min (ref >60)
Glucose, Bld: 120 mg/dL — ABNORMAL HIGH (ref 70–99)
Potassium: 4.1 mmol/L (ref 3.5–5.1)
Sodium: 142 mmol/L (ref 135–145)

## 2019-07-19 LAB — PROTIME-INR
INR: 1.1 (ref 0.8–1.2)
Prothrombin Time: 14.1 seconds (ref 11.4–15.2)

## 2019-07-19 LAB — GLUCOSE, CAPILLARY
Glucose-Capillary: 102 mg/dL — ABNORMAL HIGH (ref 70–99)
Glucose-Capillary: 92 mg/dL (ref 70–99)

## 2019-07-19 LAB — MRSA PCR SCREENING: MRSA by PCR: NEGATIVE

## 2019-07-19 LAB — SARS CORONAVIRUS 2 BY RT PCR (HOSPITAL ORDER, PERFORMED IN ~~LOC~~ HOSPITAL LAB): SARS Coronavirus 2: NEGATIVE

## 2019-07-19 LAB — APTT: aPTT: 29 seconds (ref 24–36)

## 2019-07-19 LAB — HEMOGLOBIN A1C
Hgb A1c MFr Bld: 6.7 % — ABNORMAL HIGH (ref 4.8–5.6)
Mean Plasma Glucose: 145.59 mg/dL

## 2019-07-19 MED ORDER — BIOTENE DRY MOUTH MT LIQD: 15.0000 mL | OROMUCOSAL | Status: DC | PRN

## 2019-07-19 MED ORDER — GLYCOPYRROLATE 0.2 MG/ML IJ SOLN: 0.2000 mg | INTRAMUSCULAR | Status: DC | PRN

## 2019-07-19 MED ORDER — SODIUM CHLORIDE 0.9 % IV BOLUS
500.0000 mL | Freq: Once | INTRAVENOUS | Status: AC
  Administered 2019-07-19: 500 mL via INTRAVENOUS

## 2019-07-19 MED ORDER — SODIUM CHLORIDE 0.9 % IV SOLN
0.0000 ug/min | INTRAVENOUS | Status: DC
  Filled 2019-07-19: qty 1

## 2019-07-19 MED ORDER — POLYVINYL ALCOHOL 1.4 % OP SOLN
1.0000 [drp] | Freq: Four times a day (QID) | OPHTHALMIC | Status: DC | PRN
  Filled 2019-07-19: qty 15

## 2019-07-19 MED ORDER — MORPHINE SULFATE (PF) 2 MG/ML IV SOLN
1.0000 mg | INTRAVENOUS | Status: DC | PRN
  Administered 2019-07-19 – 2019-07-23 (×3): 1 mg via INTRAVENOUS
  Filled 2019-07-19 (×2): qty 1

## 2019-07-19 MED ORDER — ACETAMINOPHEN 650 MG RE SUPP: 650.0000 mg | Freq: Four times a day (QID) | RECTAL | Status: DC | PRN

## 2019-07-19 MED ORDER — GLYCOPYRROLATE 0.2 MG/ML IJ SOLN
0.2000 mg | INTRAMUSCULAR | Status: DC | PRN
  Administered 2019-07-20 – 2019-07-23 (×3): 0.2 mg via INTRAVENOUS
  Filled 2019-07-19 (×3): qty 1

## 2019-07-19 MED ORDER — CHLORHEXIDINE GLUCONATE CLOTH 2 % EX PADS: 6.0000 | MEDICATED_PAD | Freq: Every day | CUTANEOUS | Status: DC

## 2019-07-19 MED ORDER — GLYCOPYRROLATE 1 MG PO TABS
1.0000 mg | ORAL_TABLET | ORAL | Status: DC | PRN
  Filled 2019-07-19: qty 1

## 2019-07-19 MED ORDER — ONDANSETRON 4 MG PO TBDP
4.0000 mg | ORAL_TABLET | Freq: Four times a day (QID) | ORAL | Status: DC | PRN
  Filled 2019-07-19: qty 1

## 2019-07-19 MED ORDER — MORPHINE 100MG IN NS 100ML (1MG/ML) PREMIX INFUSION
1.0000 mg/h | INTRAVENOUS | Status: DC
  Administered 2019-07-19: 11:00:00 1 mg/h via INTRAVENOUS
  Administered 2019-07-21 – 2019-07-22 (×2): 3 mg/h via INTRAVENOUS
  Filled 2019-07-19 (×3): qty 100

## 2019-07-19 MED ORDER — ONDANSETRON HCL 4 MG/2ML IJ SOLN: 4.0000 mg | Freq: Four times a day (QID) | INTRAMUSCULAR | Status: DC | PRN

## 2019-07-19 MED ORDER — HALOPERIDOL 0.5 MG PO TABS
0.5000 mg | ORAL_TABLET | ORAL | Status: DC | PRN
  Filled 2019-07-19: qty 1

## 2019-07-19 MED ORDER — ATROPINE SULFATE 1 MG/10ML IJ SOSY: 1.0000 mg | PREFILLED_SYRINGE | INTRAMUSCULAR | Status: DC | PRN

## 2019-07-19 MED ORDER — HALOPERIDOL LACTATE 2 MG/ML PO CONC
0.5000 mg | ORAL | Status: DC | PRN
  Filled 2019-07-19: qty 0.3

## 2019-07-19 MED ORDER — LORAZEPAM 1 MG PO TABS: 1.0000 mg | ORAL_TABLET | ORAL | Status: DC | PRN

## 2019-07-19 MED ORDER — HALOPERIDOL LACTATE 5 MG/ML IJ SOLN: 0.5000 mg | INTRAMUSCULAR | Status: DC | PRN

## 2019-07-19 MED ORDER — LORAZEPAM 2 MG/ML PO CONC: 1.0000 mg | ORAL | Status: DC | PRN

## 2019-07-19 MED ORDER — LORAZEPAM 2 MG/ML IJ SOLN: 1.0000 mg | INTRAMUSCULAR | Status: DC | PRN

## 2019-07-19 MED ORDER — ACETAMINOPHEN 325 MG PO TABS: 650.0000 mg | ORAL_TABLET | Freq: Four times a day (QID) | ORAL | Status: DC | PRN

## 2019-07-19 NOTE — Progress Notes (Signed)
eLink Physician-Brief Progress Note Patient Name: Erica Morton DOB: 1946/02/06 MRN: [id number]   Date of Service  07/19/2019  HPI/Events of Note  Pt admitted with a traumaticmsubarachnoid hemorrhage s/p fall.  eICU Interventions  New patient evaluation completed.        Kerry Kass Lilianna Case 07/19/2019, 3:04 AM

## 2019-07-19 NOTE — Progress Notes (Signed)
Patient is currently followed by [facility] at [facility]. CSW Annamaria Boots made aware. Flo Shanks BSN, RN, [place] [phone]

## 2019-07-19 NOTE — Progress Notes (Signed)
Unable to complete NIH Stroke assessment as the patient is confused at baseline.  Patient is not interactive and is unable to follow commands.

## 2019-07-19 NOTE — Consult Note (Signed)
Referring Physician:  No referring provider defined for this encounter.  Primary Physician:  [facility], [facility]  Chief Complaint:  Traumatic SAH  History of Present Illness: 07/19/2019 Erica Morton is a 73 y.o. female who presents with the chief complaint of fall.  She has baseline history of advanced dementia and has suffered several falls over the past few days.  She is near baseline per report, but may be slightly more altered consistent with prior health events such as UTI.  On presentation, she was oriented x 0.  CT scan showed small traumatic SAH.  She was admitted to the hospitalist service and neurosurgery consulted for management recommendations.  The patient is unable to give any history given advanced dementia and altered mental status.   Review of Systems:  A 10 point review of systems is negative, except for the pertinent positives and negatives detailed in the HPI.  Past Medical History: Past Medical History:  Diagnosis Date  . Anxiety   . Dementia ([place])   . Depression   . Hyperlipidemia   . Hypertension   . Memory deficit   . Thyroid disease   . Vitamin D deficiency     Past Surgical History: Past Surgical History:  Procedure Laterality Date  . ABDOMINAL HYSTERECTOMY      Allergies: Allergies as of 07/18/2019 - Review Complete 07/18/2019  Allergen Reaction Noted  . Metformin Diarrhea     Medications:  Current Facility-Administered Medications:  .   stroke: mapping our early stages of recovery book, , Does not apply, Once, Ouma, [employer], NP .  0.9 %  sodium chloride infusion, , Intravenous, Continuous, Ouma, Bing Neighbors, NP, Last Rate: 75 mL/hr at 07/19/19 0600 .  acetaminophen (TYLENOL) tablet 650 mg, 650 mg, Oral, Q4H PRN **OR** acetaminophen (TYLENOL) solution 650 mg, 650 mg, Per Tube, Q4H PRN **OR** acetaminophen (TYLENOL) suppository 650 mg, 650 mg, Rectal, Q4H PRN, Ouma, Bing Neighbors, NP .  atropine 1 MG/10ML  injection 1 mg, 1 mg, Intravenous, PRN, Bradly Bienenstock, NP .  Chlorhexidine Gluconate Cloth 2 % PADS 6 each, 6 each, Topical, Daily, Darel Hong D, NP .  cholecalciferol (VITAMIN D3) tablet 2,000 Units, 2,000 Units, Oral, Daily, Ouma, Bing Neighbors, NP .  docusate (COLACE) 50 MG/5ML liquid 100 mg, 100 mg, Oral, BID, Ouma, Bing Neighbors, NP .  escitalopram (LEXAPRO) tablet 10 mg, 10 mg, Oral, Daily, Ouma, Bing Neighbors, NP .  insulin aspart (novoLOG) injection 0-5 Units, 0-5 Units, Subcutaneous, QHS, Ouma, Elizabeth Achieng, NP .  insulin aspart (novoLOG) injection 0-9 Units, 0-9 Units, Subcutaneous, TID WC, Ouma, Bing Neighbors, NP .  labetalol (NORMODYNE) injection 10 mg, 10 mg, Intravenous, Q2H PRN, Lang Snow, NP, 10 mg at 07/18/19 2120 .  levothyroxine (SYNTHROID) tablet 100 mcg, 100 mcg, Oral, Daily, Ouma, Bing Neighbors, NP .  meloxicam (MOBIC) tablet 7.5 mg, 7.5 mg, Oral, Daily, Ouma, Bing Neighbors, NP .  mirtazapine (REMERON) tablet 15 mg, 15 mg, Oral, QHS, Ouma, Bing Neighbors, NP .  neomycin-bacitracin-polymyxin (NEOSPORIN) ointment packet 1 application, 1 application, Topical, Daily, Ouma, Bing Neighbors, NP .  OLANZapine (ZYPREXA) tablet 5 mg, 5 mg, Oral, QPM, Ouma, Bing Neighbors, NP .  pantoprazole (PROTONIX) injection 40 mg, 40 mg, Intravenous, QHS, Ouma, Bing Neighbors, NP .  phenylephrine (NEO-SYNEPHRINE) 10 mg in sodium chloride 0.9 % 250 mL (0.04 mg/mL) infusion, 0-400 mcg/min, Intravenous, Titrated, Darel Hong D, NP .  rivastigmine (EXELON) 4.6 mg/24hr 4.6 mg, 4.6 mg, Transdermal, Daily, Ouma, Bing Neighbors, NP .  senna-docusate (Senokot-S) tablet 1 tablet, 1 tablet, Oral, BID, Ouma, Hubbard Hartshorn, NP .  vitamin C (ASCORBIC ACID) tablet 1,000 mg, 1,000 mg, Oral, Daily, Ouma, Hubbard Hartshorn, NP   Social History: Social History   Tobacco Use  . Smoking status: Never Smoker  . Smokeless tobacco: Never Used   Substance Use Topics  . Alcohol use: No    Frequency: Never  . Drug use: No    Family Medical History: Family History  Problem Relation Age of Onset  . Heart attack Mother   . Non-Hodgkin's lymphoma Father   . Breast cancer Neg Hx     Physical Examination: Vitals:   07/19/19 0700 07/19/19 0752  BP: (!) 118/52   Pulse: (!) 39   Resp: 13   Temp:  (!) 96.7 F (35.9 C)  SpO2: 100%      General: Patient is well developed, well nourished, calm, collected, and in no apparent distress.  Psychiatric: Patient is non-anxious.  Head:  Pupils equal, round, and reactive to light.  ENT:  Oral mucosa appears well hydrated.  Neck:   Supple.  Uncooperative with ROM.  Respiratory: Patient is breathing without any difficulty.  Extremities: No edema.  Vascular: Palpable pulses in dorsal pedal vessels.  Skin:   On exposed skin, there are a few areas of bruising but no abnormal skin lesions.  NEUROLOGICAL:  General: In no acute distress. She will not engage examiner.  Does not speak.  Will not open eyes.     Oriented x 0.  Pupils equal round and reactive to light.  Facial grimaceis symmetric. Will not protrude tongue or cooperative with pronator drift.  Uncooperative with ROM examination of neck.  She localizes to pain bilaterally and briskly, but will not follow commands for full extremity strength examination.  She withdraws her BLE briskly.   Unreliable sensory examination. Reflexes 2+.  Gait is untested due to critical illness.  Imaging: MRI brain 07/18/2019 IMPRESSION: 1. Approximate 1 cm focus of acute subarachnoid hemorrhage involving the posterior left temporal region, corresponding with abnormality on prior CT. No underlying mass lesion. 2. 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 Finding is new relative to most recent available  MRI from 2016. 3. 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 4. 7 mm focus of mild diffusion abnormality involving the left frontal periventricular white matter, consistent with subacute small vessel ischemic change. 5. 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 6. Underlying atrophy with mild to moderate chronic microvascular ischemic disease.   Electronically Signed   By: Rise Mu M.D.   On: 07/18/2019 22:28  I have personally reviewed the images and agree with the above interpretation.  Assessment and Plan: Erica Morton is a pleasant 73 y.o. female with advanced dementia and multiple recent falls.  She has a small amount of traumatic subarachnoid hemorrhage in the left temporal lobe, and MRI evidence of multiple minor head traumas (siderosis, contusion).    I would recommend against any aggressive measures such as surgery, as that would not change her course.  She will likely survive this subarachnoid hemorrhage, but her dementia appears end stage.  For management of her subarachnoid hemorrhage, I would recommend she not be on blood thinners for the remainder of her life given her risk for falls.  She is not a good candidate for AEDs given her baseline  mental status and dementia.    Other care per primary team and ICU specialists.   Mutasim Tuckey K. Myer HaffYarbrough MD, MPHS Dept. of Neurosurgery  Please note that this patient was unable to participate in HPI due to critical illness and advanced dementia, prompting Level 5 consultation.

## 2019-07-19 NOTE — Progress Notes (Signed)
New referral for [facility] home received from [place]. , Writer met in the room with patient's daughter Erica Morton, son Erica Morton and granddaughter to initiate education regarding hospice services, philosophy team approach to care and current visitation policy with understanding voiced. Patient appeared comfortable with relaxed facial muscles and shallow even respirations. Erica Morton felt she had been uncomfortable earlier, but appears better now. Patient was started on a continuous morphine drip this morning, currently running at 2 mg/hr. Family and hospital care team aware there are currently no beds available at the hospice home. Patient information faxed to referral, will continue to follow. Flo Shanks BSN, RN, [facility] [phone]

## 2019-07-19 NOTE — ED Notes (Signed)
This nurse called lab and informed them that this pt covid swab order was switched from to send out to quick swab. Lab stated that they still had the swab and would be able to start the test.

## 2019-07-19 NOTE — Consult Note (Signed)
Referring Physician: Cherlynn Kaiser    Chief Complaint: AMS  HPI: Erica Morton is an 73 y.o. female with a history of dementia who is unable to provide any history today due to mental status.  Per patient's daughter, patient has had several falls over the last 5 days with injury to her head.  Patient's daughter also noticed that patient is a little bit more altered from baseline and there patient was brought in for evaluation.     Date last known well: Unable to determine Time last known well: Unable to determine tPA Given: No: SAH  Past Medical History:  Diagnosis Date  . Anxiety   . Dementia (HCC)   . Depression   . Hyperlipidemia   . Hypertension   . Memory deficit   . Thyroid disease   . Vitamin D deficiency     Past Surgical History:  Procedure Laterality Date  . ABDOMINAL HYSTERECTOMY      Family History  Problem Relation Age of Onset  . Heart attack Mother   . Non-Hodgkin's lymphoma Father   . Breast cancer Neg Hx    Social History:  reports that she has never smoked. She has never used smokeless tobacco. She reports that she does not drink alcohol or use drugs.  Allergies:  Allergies  Allergen Reactions  . Metformin Diarrhea    Medications:  I have reviewed the patient's current medications. Prior to Admission:  Medications Prior to Admission  Medication Sig Dispense Refill Last Dose  . Ascorbic Acid (VITAMIN C) 1000 MG tablet Take 1,000 mg by mouth daily.   07/15/2019 at 0800  . Cholecalciferol (VITAMIN D) 50 MCG (2000 UT) tablet Take 2,000 Units by mouth daily.   07/15/2019 at 0800  . Docusate Sodium 150 MG/15ML syrup Take 100 mg by mouth 2 (two) times daily.   07/17/2019 at 2000  . escitalopram (LEXAPRO) 10 MG tablet Take 10 mg by mouth daily.    07/15/2019 at 0800  . levothyroxine (SYNTHROID, LEVOTHROID) 100 MCG tablet Take 100 mcg by mouth daily.    07/18/2019 at 0700  . Melatonin 3 MG TABS Take 3 mg by mouth at bedtime as needed (sleep).    07/17/2019  at PRN  . meloxicam (MOBIC) 7.5 MG tablet Take 7.5 mg by mouth daily.   07/18/2019 at 0600  . mirtazapine (REMERON) 15 MG tablet Take 15 mg by mouth at bedtime.    07/17/2019 at 2000  . neomycin-bacitracin-polymyxin (NEOSPORIN) ointment Apply 1 application topically daily. (apply to the left eyebrow and left knee)   07/18/2019 at 1000  . NONFORMULARY OR COMPOUNDED ITEM Apply 1 application topically every 6 (six) hours as needed (anxiety). **LORAZEPAM 0.5mg /ml Topical Gel**   07/17/2019 at PRN  . OLANZapine (ZYPREXA) 5 MG tablet Take 5 mg by mouth every evening.   07/17/2019 at 1800  . rivastigmine (EXELON) 4.6 mg/24hr Place 4.6 mg onto the skin daily.   07/15/2019 at 0800  . acetaminophen (TYLENOL) 325 MG tablet Take 2 tablets (650 mg total) by mouth 3 (three) times daily. (Patient not taking: Reported on 07/18/2019) 20 tablet 0 Not Taking at Unknown time  . magnesium citrate SOLN Take 148 mLs (0.5 Bottles total) by mouth daily as needed for severe constipation. (Patient not taking: Reported on 02/23/2019) 195 mL 0 Not Taking at Unknown time   Scheduled: . senna-docusate  1 tablet Oral BID    ROS: Unable to provide  Physical Examination: Blood pressure (!) 118/52, pulse (!) 39, temperature (!) 96.7  F (35.9 C), temperature source Axillary, resp. rate 13, height 5\' 2"  (1.575 m), weight 50.9 kg, SpO2 100 %.  HEENT-  Normocephalic, no lesions, without obvious abnormality.  Normal external eye and conjunctiva.  Normal TM's bilaterally.  Normal auditory canals and external ears. Normal external nose, mucus membranes and septum.  Normal pharynx. Cardiovascular- S1, S2 normal, pulses palpable throughout   Lungs- chest clear, no wheezing, rales, normal symmetric air entry Abdomen- soft, non-tender; bowel sounds normal; no masses,  no organomegaly Extremities- no edema Lymph-no adenopathy palpable Musculoskeletal-no joint tenderness, deformity or swelling Skin-warm and dry, no hyperpigmentation,  vitiligo, or suspicious lesions  Neurological Examination   Mental Status: Grimaces and localizes to deep sternal rub.  Does not follow commands.  No verbalizations noted.  Cranial Nerves: II: patient does not respond confrontation bilaterally, pupils right 3 mm, left 3 mm,and reactive bilaterally III,IV,VI: Oculocephalic response present bilaterally.  V,VII: corneal reflex present bilaterally  VIII: patient does not respond to verbal stimuli IX,X: gag reflex reduced, XI: trapezius strength unable to test bilaterally XII: tongue strength unable to test Motor: Increased tone throughout but able to move all extremities Sensory: Responds to noxious stimuli in all extremities Deep Tendon Reflexes:  Symmetric throughout. Plantars: Mute bilaterally Cerebellar: Unable to perform  Laboratory Studies:  Basic Metabolic Panel: Recent Labs  Lab 07/18/19 1748 07/19/19 0444  NA 140 142  K 4.6 4.1  CL 104 110  CO2 25 24  GLUCOSE 142* 120*  BUN 24* 20  CREATININE 0.77 0.87  CALCIUM 9.6 8.2*    Liver Function Tests: No results for input(s): AST, ALT, ALKPHOS, BILITOT, PROT, ALBUMIN in the last 168 hours. No results for input(s): LIPASE, AMYLASE in the last 168 hours. No results for input(s): AMMONIA in the last 168 hours.  CBC: Recent Labs  Lab 07/18/19 1748  WBC 10.3  HGB 14.6  HCT 42.4  MCV 95.9  PLT 226    Cardiac Enzymes: No results for input(s): CKTOTAL, CKMB, CKMBINDEX, TROPONINI in the last 168 hours.  BNP: Invalid input(s): POCBNP  CBG: Recent Labs  Lab 07/19/19 0226 07/19/19 0736  GLUCAP 102* 92    Microbiology: Results for orders placed or performed during the hospital encounter of 07/18/19  SARS Coronavirus 2 by RT PCR (hospital order, performed in [facility] hospital lab) Nasopharyngeal Nasopharyngeal Swab     Status: None   Collection Time: 07/19/19 12:20 AM   Specimen: Nasopharyngeal Swab  Result Value Ref Range Status   SARS Coronavirus 2  NEGATIVE NEGATIVE Final    Comment: (NOTE) If result is NEGATIVE SARS-CoV-2 target nucleic acids are NOT DETECTED. The SARS-CoV-2 RNA is generally detectable in upper and lower  respiratory specimens during the acute phase of infection. The lowest  concentration of SARS-CoV-2 viral copies this assay can detect is 250  copies / mL. A negative result does not preclude SARS-CoV-2 infection  and should not be used as the sole basis for treatment or other  patient management decisions.  A negative result may occur with  improper specimen collection / handling, submission of specimen other  than nasopharyngeal swab, presence of viral mutation(s) within the  areas targeted by this assay, and inadequate number of viral copies  (<250 copies / mL). A negative result must be combined with clinical  observations, patient history, and epidemiological information. If result is POSITIVE SARS-CoV-2 target nucleic acids are DETECTED. The SARS-CoV-2 RNA is generally detectable in upper and lower  respiratory specimens dur ing the acute phase  of infection.  Positive  results are indicative of active infection with SARS-CoV-2.  Clinical  correlation with patient history and other diagnostic information is  necessary to determine patient infection status.  Positive results do  not rule out bacterial infection or co-infection with other viruses. If result is PRESUMPTIVE POSTIVE SARS-CoV-2 nucleic acids MAY BE PRESENT.   A presumptive positive result was obtained on the submitted specimen  and confirmed on repeat testing.  While 2019 novel coronavirus  (SARS-CoV-2) nucleic acids may be present in the submitted sample  additional confirmatory testing may be necessary for epidemiological  and / or clinical management purposes  to differentiate between  SARS-CoV-2 and other Sarbecovirus currently known to infect humans.  If clinically indicated additional testing with an alternate test  methodology [phone])  is advised. The SARS-CoV-2 RNA is generally  detectable in upper and lower respiratory sp ecimens during the acute  phase of infection. The expected result is Negative. Fact Sheet for Patients:  StrictlyIdeas.no Fact Sheet for Healthcare Providers: BankingDealers.co.za This test is not yet approved or cleared by the [place] FDA and has been authorized for detection and/or diagnosis of SARS-CoV-2 by FDA under an Emergency Use Authorization (EUA).  This EUA will remain in effect (meaning this test can be used) for the duration of the COVID-19 declaration under Section 564(b)(1) of the Act, 21 U.S.C. section 360bbb-3(b)(1), unless the authorization is terminated or revoked sooner. Performed at [facility], [address] [place], [place] [address]   MRSA PCR Screening     Status: None   Collection Time: 07/19/19  2:46 AM   Specimen: Nasopharyngeal  Result Value Ref Range Status   MRSA by PCR NEGATIVE NEGATIVE Final    Comment:        The GeneXpert MRSA Assay (FDA approved for NASAL specimens only), is one component of a comprehensive MRSA colonization surveillance program. It is not intended to diagnose MRSA infection nor to guide or monitor treatment for MRSA infections. Performed at [facility], [address] [place], [place] [address]     Coagulation Studies: Recent Labs    07/19/19 0444  LABPROT 14.1  INR 1.1    Urinalysis:  Recent Labs  Lab 07/18/19 1748  COLORURINE YELLOW*  LABSPEC 1.017  PHURINE 7.0  GLUCOSEU NEGATIVE  HGBUR NEGATIVE  BILIRUBINUR NEGATIVE  KETONESUR 5*  PROTEINUR NEGATIVE  NITRITE NEGATIVE  LEUKOCYTESUR NEGATIVE    Lipid Panel: No results found for: CHOL, TRIG, HDL, CHOLHDL, VLDL, LDLCALC  HgbA1C:  Lab Results  Component Value Date   HGBA1C 6.7 (H) 07/19/2019    Urine Drug Screen:      Component Value Date/Time   LABOPIA NONE DETECTED 02/13/2018  1445   COCAINSCRNUR NONE DETECTED 02/13/2018 1445   LABBENZ NONE DETECTED 02/13/2018 1445   AMPHETMU NONE DETECTED 02/13/2018 1445   THCU NONE DETECTED 02/13/2018 1445   LABBARB NONE DETECTED 02/13/2018 1445    Alcohol Level: No results for input(s): ETH in the last 168 hours.  Imaging: Ct Head Wo Contrast  Addendum Date: 07/18/2019   ADDENDUM REPORT: 07/18/2019 18:38 ADDENDUM: Critical Value/emergent results were called by telephone at the time of interpretation on 07/18/2019 at 6:38 pm to [place] , who verbally acknowledged these results. Electronically Signed   By: Donavan Foil M.D.   On: 07/18/2019 18:38   Result Date: 07/18/2019 CLINICAL DATA:  Altered mental status EXAM: CT HEAD WITHOUT CONTRAST TECHNIQUE: Contiguous axial images were obtained from the base of the  skull through the vertex without intravenous contrast. COMPARISON:  CT brain 10/20/2018 FINDINGS: Brain: New hypodensity within the left frontal lobe since January 2020, appearance suggests subacute to developing chronic infarct. 8 mm focal hyperdensity at the left parietal temporal junction either representing a small focus of extra-axial/subarachnoid hemorrhage, though given slightly ovoid configuration, hemorrhagic mass is also possible. No midline shift or significant mass effect. Atrophy. Small vessel ischemic changes of the white matter. Stable ventricle size. Vascular: No hyperdense vessel.  Carotid vascular calcification. Skull: Normal. Negative for fracture or focal lesion. Sinuses/Orbits: No acute finding. Other: None IMPRESSION: 1. Focal 8 mm hyperdensity along the left convexity at the junction of the temporal and parietal lobes, favored to represent small amount of extra-axial hemorrhage/probable subarachnoid hemorrhage given history of multiple falls. Given ovoid configuration however small hemorrhagic mass is also considered in the differential. Evaluation with MRI may be considered. 2. Focal hypodensity in  the left frontal lobe, new since January 2020, with an appearance suggesting subacute to developing chronic infarction. 3. Atrophy and small vessel ischemic changes of the white matter. Electronically Signed: By: Jasmine PangKim  Fujinaga M.D. On: 07/18/2019 18:31   Mr Brain Wo Contrast  Result Date: 07/18/2019 CLINICAL DATA:  Initial evaluation for subarachnoid hemorrhage, history of multiple recent falls, altered mental status. EXAM: MRI HEAD WITHOUT CONTRAST TECHNIQUE: Multiplanar, multiecho pulse sequences of the brain and surrounding structures were obtained without intravenous contrast. COMPARISON:  Prior head CT from earlier the same day. FINDINGS: Brain: Diffuse prominence of the CSF containing spaces compatible with generalized age-related cerebral atrophy. Patchy and confluent T2/FLAIR hyperintensity within the periventricular deep white matter both cerebral hemispheres most consistent with chronic small vessel ischemic disease, mild to moderate in nature. Focal area of encephalomalacia and gliosis involving the left frontal operculum consistent with a chronic left MCA territory infarct (series 15, image 36). There is a 7 mm focus of mild diffusion abnormality involving the left frontal periventricular white matter, adjacent to the left frontal horn, consistent with a subacute small vessel type white matter infarct (series 5, image 28). No other evidence for acute or subacute ischemia. Small focus of acute subarachnoid hemorrhage measuring approximately 1 cm seen at the posterior left temporal region (series 15, image 29). No underlying mass. 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 Findings are new relative to previous MRI. Additionally, focal area of encephalomalacia and gliosis involving the anterior right temporal horn  consistent with a chronic cortical contusion, also new from previous MRI (series 15, image 17). Few additional scattered superimposed chronic micro hemorrhages noted involving both cerebral hemispheres, nonspecific, but likely hypertensive/small vessel related. No mass lesion, midline shift or mass effect. No hydrocephalus. No intraventricular hemorrhage. No extra-axial fluid collection. Pituitary gland suprasellar region normal. Midline structures intact. Vascular: Major intracranial vascular flow voids are maintained. Skull and upper cervical spine: Craniocervical junction within normal limits. Upper cervical spine normal. Bone marrow signal intensity within normal limits. No acute abnormality about the scalp. Sinuses/Orbits: Globes and orbital soft tissues within normal limits. Paranasal sinuses are clear. No mastoid effusion. Inner ear structures grossly normal. Other: None. IMPRESSION: 1. Approximate 1 cm focus of acute subarachnoid hemorrhage involving the posterior left temporal region, corresponding with abnormality on prior CT. No underlying mass lesion. 2. 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 Finding is new relative to  most recent available MRI from 2016. 3. 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 4. 7 mm focus of mild diffusion abnormality involving the left frontal periventricular white matter, consistent with subacute small vessel ischemic change. 5. 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 6. Underlying atrophy with mild to moderate chronic microvascular ischemic disease. Electronically Signed   By: Rise Mu M.D.   On: 07/18/2019 22:28    Assessment: 73 y.o. female with a history of dementia and recent frequent falls  who presented with altered mental status.  MRI of the brain performed and a small left SAH and subacute left frontal infarct.  Chronic ischemic and hemosiderin noted as well.  Although infarct noted with hemorrhage findings, new and old would not start  Antiplatelet therapy at this time.  Patient not particularly responsive and patient considered comfort care at this time and therefore will not initiate a stroke work up but focus on patient comfort.    Stroke Risk Factors - hyperlipidemia and hypertension  Plan: 1. Neuro checks. 2. Agree with comfort care.     Thana Farr, MD Neurology [phone] 07/19/2019, 11:06 AM

## 2019-07-19 NOTE — TOC Initial Note (Signed)
Transition of Care [facility]) - Initial/Assessment Note    Patient Details  Name: Erica Morton MRN: [id number] Date of Birth: 11-Jan-1946  [facility] CM/SW Contact:    Annamaria Boots, [place] Phone Number: 07/19/2019, 2:42 PM  Clinical Narrative:  Patient is a long term resident at [facility] care. Patient has had multiple falls recently and experienced a decline. Per daughter, patient was walking with no assistive device prior to hospitalization. Daughter also reports that patient was followed by Palliative care through [facility]. Daughter states that MD has recommended comfort care and family is in agreement. Family would like patient tranferred to hospice home in [facility] when a bed is available. CSW explained that there is not a bed at this time but patient would be placed on the waiting list. CSW also notified Santiago Glad, hospice liaison of hospice home referral. CSW will continue to follow to assist with discharge needs.                        Patient Goals and CMS Choice        Expected Discharge Plan and Services                                                Prior Living Arrangements/Services                       Activities of Daily Living      Permission Sought/Granted                  Emotional Assessment              Admission diagnosis:  Somnolence [R40.0] Traumatic hemorrhage of cerebrum with loss of consciousness, unspecified laterality, initial encounter ([place]) [J15.520E] SAH (subarachnoid hemorrhage) ([place]) [I60.9] Patient Active Problem List   Diagnosis Date Noted  . SAH (subarachnoid hemorrhage) ([place]) 07/18/2019  . Hypotension 10/20/2018  . Palliative care encounter 10/10/2018  . Memory deficits 10/10/2018  . Dysphagia 10/10/2018  . Diabetes mellitus type 2, controlled ([place]) 12/12/2017  . HTN (hypertension) 12/12/2017  . Hypothyroidism 12/12/2017  . Hyperlipidemia 12/12/2017  . Vitamin D  deficiency 12/12/2017  . Gait instability 12/12/2017  . Mixed Alzheimer's and vascular dementia ([place]) 12/12/2017  . Chronic neck pain 10/04/2016  . Depression 09/30/2014   PCP:  [facility]:   [address] [place] [address] Phone: [phone] Fax: [phone]     Social Determinants of Health (SDOH) Interventions    Readmission Risk Interventions No flowsheet data found.

## 2019-07-19 NOTE — H&P (Addendum)
[facility] - [place] at [facility]   PATIENT NAME: Erica Morton    MR#:  [id number]  DATE OF BIRTH:  02-09-1946  DATE OF ADMISSION:  07/18/2019  PRIMARY CARE PHYSICIAN: [facility]   REQUESTING/REFERRING PHYSICIAN: Sharyn Creamer MD  CHIEF COMPLAINT:   Chief Complaint  Patient presents with   Fall    HISTORY OF PRESENT ILLNESS:  73 y.o. female with pertinent past medical history of diabetes mellitus, hypertension, hypothyroidism, mixed dementia, hyperlipidemia, anxiety and depression presenting to the ED with altered mental status s/p multiple falls.  Patient has history of dementia history obtained from patient's daughter who is currently at the bedside and ED reports.  Per patient's daughter, patient's had several falls over the last 5 days with injury to her head.  Patient's daughter  also noticed that patient is a little bit more altered from baseline similar to previous episodes when she had issues like urinary tract infection.  On arrival to the ED, she was afebrile with blood pressure 201/114 mm Hg and pulse rate 76 beats/min. There were no focal neurological deficits; she was alert but disoriented x4.  Initial labs showed an unremarkable CBC and BMP.  UA negative for UTI.  Head showed probable subarachnoid hemorrhage and focal hypodensity in the left frontal lobe suggesting subacute to developing chronic infarction.  Follow-up MRI of the brain showed acute subarachnoid hemorrhage involving the posterior left temporal region and prior traumatic SAH and 7 mm mild diffusion abnormality involving the left frontal periventricular white matter consistent with subacute small vessel ischemic change.  Given this abnormality patient will be admitted under hospitalist service for further management.  Patient's daughter at the bedside is in agreement with admission but stated would not wish to escalate care if patient's condition were to deteriorate.  PAST  MEDICAL HISTORY:   Past Medical History:  Diagnosis Date   Anxiety    Dementia (HCC)    Depression    Hyperlipidemia    Hypertension    Memory deficit    Thyroid disease    Vitamin D deficiency     PAST SURGICAL HISTORY:   Past Surgical History:  Procedure Laterality Date   ABDOMINAL HYSTERECTOMY      SOCIAL HISTORY:   Social History   Tobacco Use   Smoking status: Never Smoker   Smokeless tobacco: Never Used  Substance Use Topics   Alcohol use: No    Frequency: Never    FAMILY HISTORY:   Family History  Problem Relation Age of Onset   Heart attack Mother    Non-Hodgkin's lymphoma Father    Breast cancer Neg Hx     DRUG ALLERGIES:   Allergies  Allergen Reactions   Metformin Diarrhea    REVIEW OF SYSTEMS:   Review of Systems  Unable to perform ROS: Dementia   MEDICATIONS AT HOME:   Prior to Admission medications   Medication Sig Start Date End Date Taking? Authorizing Provider  Ascorbic Acid (VITAMIN C) 1000 MG tablet Take 1,000 mg by mouth daily.   Yes [provider]  Cholecalciferol (VITAMIN D) 50 MCG (2000 UT) tablet Take 2,000 Units by mouth daily.   Yes [provider]  Docusate Sodium 150 MG/15ML syrup Take 100 mg by mouth 2 (two) times daily.   Yes [provider]  escitalopram (LEXAPRO) 10 MG tablet Take 10 mg by mouth daily.    Yes [provider]  levothyroxine (SYNTHROID, LEVOTHROID) 100 MCG tablet Take 100 mcg by  mouth daily.    Yes [provider]  Melatonin 3 MG TABS Take 3 mg by mouth at bedtime as needed (sleep).    Yes [provider]  meloxicam (MOBIC) 7.5 MG tablet Take 7.5 mg by mouth daily.   Yes [provider]  mirtazapine (REMERON) 15 MG tablet Take 15 mg by mouth at bedtime.    Yes [provider]  neomycin-bacitracin-polymyxin (NEOSPORIN) ointment Apply 1 application topically daily. (apply to the left eyebrow and left knee)   Yes  [provider]  NONFORMULARY OR COMPOUNDED ITEM Apply 1 application topically every 6 (six) hours as needed (anxiety). **LORAZEPAM 0.5mg /ml Topical Gel**   Yes [provider]  OLANZapine (ZYPREXA) 5 MG tablet Take 5 mg by mouth every evening.   Yes [provider]  rivastigmine (EXELON) 4.6 mg/24hr Place 4.6 mg onto the skin daily.   Yes [provider]  acetaminophen (TYLENOL) 325 MG tablet Take 2 tablets (650 mg total) by mouth 3 (three) times daily. Patient not taking: Reported on 07/18/2019 10/22/18   Altamese DillingVachhani, Vaibhavkumar, MD  magnesium citrate SOLN Take 148 mLs (0.5 Bottles total) by mouth daily as needed for severe constipation. Patient not taking: Reported on 02/23/2019 10/22/18   Altamese DillingVachhani, Vaibhavkumar, MD      VITAL SIGNS:  Blood pressure (!) 144/86, pulse (!) 58, temperature 97.9 F (36.6 C), temperature source Oral, resp. rate 17, height 5\' 2"  (1.575 m), weight 54.4 kg, SpO2 98 %.  PHYSICAL EXAMINATION:   Physical Exam  GENERAL:  73 y.o.-year-old patient lying in the bed with no acute distress.  EYES: Pupils equal, round, reactive to light and accommodation. No scleral icterus. Extraocular muscles intact.  HEENT: Head atraumatic, normocephalic. Oropharynx and nasopharynx clear.  NECK:  Supple, no jugular venous distention. No thyroid enlargement, no tenderness.  LUNGS: Normal breath sounds bilaterally, no wheezing, rales,rhonchi or crepitation. No use of accessory muscles of respiration.  CARDIOVASCULAR: S1, S2 normal. No murmurs, rubs, or gallops.  ABDOMEN: Soft, nontender, nondistended. Bowel sounds present. No organomegaly or mass.  EXTREMITIES: No pedal edema, cyanosis, or clubbing.  NEUROLOGIC: Cranial nerves II through XII are intact. Moves all extremities against gravity. Sensation intact. Gait not checked.  PSYCHIATRIC: The patient is alert and disoriented x 4.  SKIN: No obvious rash, lesion, or ulcer.   DATA  REVIEWED:  LABORATORY PANEL:   CBC Recent Labs  Lab 07/18/19 1748  WBC 10.3  HGB 14.6  HCT 42.4  PLT 226   ------------------------------------------------------------------------------------------------------------------  Chemistries  Recent Labs  Lab 07/18/19 1748  NA 140  K 4.6  CL 104  CO2 25  GLUCOSE 142*  BUN 24*  CREATININE 0.77  CALCIUM 9.6   ------------------------------------------------------------------------------------------------------------------  Cardiac Enzymes No results for input(s): TROPONINI in the last 168 hours. ------------------------------------------------------------------------------------------------------------------  RADIOLOGY:  Ct Head Wo Contrast  Addendum Date: 07/18/2019   ADDENDUM REPORT: 07/18/2019 18:38 ADDENDUM: Critical Value/emergent results were called by telephone at the time of interpretation on 07/18/2019 at 6:38 pm to providerMARK QUALE , who verbally acknowledged these results. Electronically Signed   By: Jasmine PangKim  Fujinaga M.D.   On: 07/18/2019 18:38   Result Date: 07/18/2019 CLINICAL DATA:  Altered mental status EXAM: CT HEAD WITHOUT CONTRAST TECHNIQUE: Contiguous axial images were obtained from the base of the skull through the vertex without intravenous contrast. COMPARISON:  CT brain 10/20/2018 FINDINGS: Brain: New hypodensity within the left frontal lobe since January 2020, appearance suggests subacute to developing chronic infarct. 8 mm focal hyperdensity at  the left parietal temporal junction either representing a small focus of extra-axial/subarachnoid hemorrhage, though given slightly ovoid configuration, hemorrhagic mass is also possible. No midline shift or significant mass effect. Atrophy. Small vessel ischemic changes of the white matter. Stable ventricle size. Vascular: No hyperdense vessel.  Carotid vascular calcification. Skull: Normal. Negative for fracture or focal lesion. Sinuses/Orbits: No acute finding. Other:  None IMPRESSION: 1. Focal 8 mm hyperdensity along the left convexity at the junction of the temporal and parietal lobes, favored to represent small amount of extra-axial hemorrhage/probable subarachnoid hemorrhage given history of multiple falls. Given ovoid configuration however small hemorrhagic mass is also considered in the differential. Evaluation with MRI may be considered. 2. Focal hypodensity in the left frontal lobe, new since January 2020, with an appearance suggesting subacute to developing chronic infarction. 3. Atrophy and small vessel ischemic changes of the white matter. Electronically Signed: By: Donavan Foil M.D. On: 07/18/2019 18:31   Mr Brain Wo Contrast  Result Date: 07/18/2019 CLINICAL DATA:  Initial evaluation for subarachnoid hemorrhage, history of multiple recent falls, altered mental status. EXAM: MRI HEAD WITHOUT CONTRAST TECHNIQUE: Multiplanar, multiecho pulse sequences of the brain and surrounding structures were obtained without intravenous contrast. COMPARISON:  Prior head CT from earlier the same day. FINDINGS: Brain: Diffuse prominence of the CSF containing spaces compatible with generalized age-related cerebral atrophy. Patchy and confluent T2/FLAIR hyperintensity within the periventricular deep white matter both cerebral hemispheres most consistent with chronic small vessel ischemic disease, mild to moderate in nature. Focal area of encephalomalacia and gliosis involving the left frontal operculum consistent with a chronic left MCA territory infarct (series 15, image 36). There is a 7 mm focus of mild diffusion abnormality involving the left frontal periventricular white matter, adjacent to the left frontal horn, consistent with a subacute small vessel type white matter infarct (series 5, image 28). No other evidence for acute or subacute ischemia. Small focus of acute subarachnoid hemorrhage measuring approximately 1 cm seen at the posterior left temporal region (series 15,  image 29). No underlying mass. 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 Findings are new relative to previous MRI. Additionally, focal area of encephalomalacia and gliosis involving the anterior right temporal horn consistent with a chronic cortical contusion, also new from previous MRI (series 15, image 17). Few additional scattered superimposed chronic micro hemorrhages noted involving both cerebral hemispheres, nonspecific, but likely hypertensive/small vessel related. No mass lesion, midline shift or mass effect. No hydrocephalus. No intraventricular hemorrhage. No extra-axial fluid collection. Pituitary gland suprasellar region normal. Midline structures intact. Vascular: Major intracranial vascular flow voids are maintained. Skull and upper cervical spine: Craniocervical junction within normal limits. Upper cervical spine normal. Bone marrow signal intensity within normal limits. No acute abnormality about the scalp. Sinuses/Orbits: Globes and orbital soft tissues within normal limits. Paranasal sinuses are clear. No mastoid effusion. Inner ear structures grossly normal. Other: None. IMPRESSION: 1. Approximate 1 cm focus of acute subarachnoid hemorrhage involving the posterior left temporal region, corresponding with abnormality on prior CT. No underlying mass lesion. 2. 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 Finding is new relative to most recent available MRI from 2016. 3. 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 4. 7 mm focus of mild diffusion abnormality involving the left frontal  periventricular white matter,  consistent with subacute small vessel ischemic change. 5. 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 6. Underlying atrophy with mild to moderate chronic microvascular ischemic disease. Electronically Signed   By: Rise Mu M.D.   On: 07/18/2019 22:28    EKG:  EKG: there are no previous tracings available.  IMPRESSION AND PLAN:   73 y.o. female with pertinent past medical history of diabetes mellitus, hypertension, hypothyroidism, mixed dementia, hyperlipidemia, anxiety and depression presenting to the ED with altered mental status s/p multiple falls.  1. Altered mental status -likely multifactorial in the setting of traumatic SAH, chronic contusions and possible ischemic event secondary to fall in a patient with underlying advanced dementia - Admit to MedSurg unit - Check urine culture - Falls precaution  2. 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 Message sent via Haiku to Dr. Marcell Barlow - Neurology consult placed. Message sent via Haiku to Dr. Thad Ranger  3. Diabetes Mellitus Type 2  - SSI  4. Hypertension - Long term Goal BP <140/90 - Labetalol PRN  5. Hyperlipidemia - LDL goal < 70 - Start Atorvastatin 20mg  PO qhs  6. Hypothyroidism -continue Synthroid  7. 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 Anxiety and depression - Continue Remeron  9. DVT prophylaxis - Hold in the setting of SAH   Discussed with the patient's daughter at the bedside, who is HCPOA, she would not wish to escalate care at this time. She would  like to discuss end-of-life care and DNR status in a.m after discussing with rest of the family.  All the records are reviewed and case discussed with ED provider. Management plans discussed with the patient, family and they are in agreement.  CODE STATUS: FULL  TOTAL TIME TAKING CARE OF THIS PATIENT: 50 minutes.    on 07/19/2019 at 12:52 AM   07/21/2019, DNP, FNP-BC [facility] Nurse Practitioner Between 7am to 6pm - Pager [phone]  After 6pm go to [email or web address] - - [phone]  [facility]  Office  [phone]  CC: Primary care physician; [facility]

## 2019-07-19 NOTE — Progress Notes (Signed)
[place] at [place] NAME: Erica Morton    MR#:  [id number]  DATE OF BIRTH:  [birth year]  SUBJECTIVE:   She admitted to the hospital secondary to multiple falls and traumatic subarachnoid hemorrhage.  Patient's family is at bedside.  Patient's family after speaking to neurology and neurosurgery has now agreed to comfort care only.  REVIEW OF SYSTEMS:    Review of Systems  Unable to perform ROS: Mental acuity    Nutrition: NPO Tolerating Diet: No   DRUG ALLERGIES:   Allergies  Allergen Reactions   Metformin Diarrhea    VITALS:  Blood pressure (!) 118/52, pulse (!) 39, temperature (!) 96.7 F (35.9 C), temperature source Axillary, resp. rate 13, height 5\' 2"  (1.575 m), weight 50.9 kg, SpO2 100 %.  PHYSICAL EXAMINATION:   Physical Exam  GENERAL:  73 y.o.-year-old demented, Encephalopathic patient lying in bed in no acute distress.  EYES: Pupils equal, round, reactive to light. NO scleral icterus.  HEENT: Head atraumatic, normocephalic. Oropharynx and nasopharynx clear.  NECK:  Supple, no jugular venous distention. No thyroid enlargement, no tenderness.  LUNGS: Normal breath sounds bilaterally, no wheezing, rales, rhonchi. No use of accessory muscles of respiration.  CARDIOVASCULAR: S1, S2 normal. No murmurs, rubs, or gallops.  ABDOMEN: Soft, nontender, nondistended. Bowel sounds present. No organomegaly or mass.  EXTREMITIES: No cyanosis, clubbing or edema b/l.    NEUROLOGIC: Encephalopathic/lethergic.  PSYCHIATRIC: The patient is alert and oriented x 1.  SKIN: No obvious rash, lesion, or ulcer.    LABORATORY PANEL:   CBC Recent Labs  Lab 07/18/19 1748  WBC 10.3  HGB 14.6  HCT 42.4  PLT 226   ------------------------------------------------------------------------------------------------------------------  Chemistries  Recent Labs  Lab 07/19/19 0444  NA 142  K 4.1  CL 110  CO2 24  GLUCOSE 120*  BUN 20   CREATININE 0.87  CALCIUM 8.2*   ------------------------------------------------------------------------------------------------------------------  Cardiac Enzymes No results for input(s): TROPONINI in the last 168 hours. ------------------------------------------------------------------------------------------------------------------  RADIOLOGY:  Ct Head Wo Contrast  Addendum Date: 07/18/2019   ADDENDUM REPORT: 07/18/2019 18:38 ADDENDUM: Critical Value/emergent results were called by telephone at the time of interpretation on 07/18/2019 at 6:38 pm to [place] , who verbally acknowledged these results. Electronically Signed   By: Donavan Foil M.D.   On: 07/18/2019 18:38   Result Date: 07/18/2019 CLINICAL DATA:  Altered mental status EXAM: CT HEAD WITHOUT CONTRAST TECHNIQUE: Contiguous axial images were obtained from the base of the skull through the vertex without intravenous contrast. COMPARISON:  CT brain 10/20/2018 FINDINGS: Brain: New hypodensity within the left frontal lobe since January 2020, appearance suggests subacute to developing chronic infarct. 8 mm focal hyperdensity at the left parietal temporal junction either representing a small focus of extra-axial/subarachnoid hemorrhage, though given slightly ovoid configuration, hemorrhagic mass is also possible. No midline shift or significant mass effect. Atrophy. Small vessel ischemic changes of the white matter. Stable ventricle size. Vascular: No hyperdense vessel.  Carotid vascular calcification. Skull: Normal. Negative for fracture or focal lesion. Sinuses/Orbits: No acute finding. Other: None IMPRESSION: 1. Focal 8 mm hyperdensity along the left convexity at the junction of the temporal and parietal lobes, favored to represent small amount of extra-axial hemorrhage/probable subarachnoid hemorrhage given history of multiple falls. Given ovoid configuration however small hemorrhagic mass is also considered in the differential.  Evaluation with MRI may be considered. 2. Focal hypodensity in the left frontal lobe, new since January 2020, with an appearance  suggesting subacute to developing chronic infarction. 3. Atrophy and small vessel ischemic changes of the white matter. Electronically Signed: By: Jasmine PangKim  Fujinaga M.D. On: 07/18/2019 18:31   Mr Brain Wo Contrast  Result Date: 07/18/2019 CLINICAL DATA:  Initial evaluation for subarachnoid hemorrhage, history of multiple recent falls, altered mental status. EXAM: MRI HEAD WITHOUT CONTRAST TECHNIQUE: Multiplanar, multiecho pulse sequences of the brain and surrounding structures were obtained without intravenous contrast. COMPARISON:  Prior head CT from earlier the same day. FINDINGS: Brain: Diffuse prominence of the CSF containing spaces compatible with generalized age-related cerebral atrophy. Patchy and confluent T2/FLAIR hyperintensity within the periventricular deep white matter both cerebral hemispheres most consistent with chronic small vessel ischemic disease, mild to moderate in nature. Focal area of encephalomalacia and gliosis involving the left frontal operculum consistent with a chronic left MCA territory infarct (series 15, image 36). There is a 7 mm focus of mild diffusion abnormality involving the left frontal periventricular white matter, adjacent to the left frontal horn, consistent with a subacute small vessel type white matter infarct (series 5, image 28). No other evidence for acute or subacute ischemia. Small focus of acute subarachnoid hemorrhage measuring approximately 1 cm seen at the posterior left temporal region (series 15, image 29). No underlying mass. 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 Findings are new relative to previous MRI. Additionally, focal area of encephalomalacia  and gliosis involving the anterior right temporal horn consistent with a chronic cortical contusion, also new from previous MRI (series 15, image 17). Few additional scattered superimposed chronic micro hemorrhages noted involving both cerebral hemispheres, nonspecific, but likely hypertensive/small vessel related. No mass lesion, midline shift or mass effect. No hydrocephalus. No intraventricular hemorrhage. No extra-axial fluid collection. Pituitary gland suprasellar region normal. Midline structures intact. Vascular: Major intracranial vascular flow voids are maintained. Skull and upper cervical spine: Craniocervical junction within normal limits. Upper cervical spine normal. Bone marrow signal intensity within normal limits. No acute abnormality about the scalp. Sinuses/Orbits: Globes and orbital soft tissues within normal limits. Paranasal sinuses are clear. No mastoid effusion. Inner ear structures grossly normal. Other: None. IMPRESSION: 1. Approximate 1 cm focus of acute subarachnoid hemorrhage involving the posterior left temporal region, corresponding with abnormality on prior CT. No underlying mass lesion. 2. 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 Finding is new relative to most recent available MRI from 2016. 3. 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 4. 7 mm focus of mild diffusion abnormality involving the left frontal periventricular white matter, consistent with subacute small vessel ischemic change. 5. 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 6. Underlying atrophy with mild to moderate chronic microvascular ischemic disease. Electronically Signed   By: Rise MuBenjamin  McClintock M.D.   On: 07/18/2019 22:28     ASSESSMENT AND PLAN:    73 year old female with past medical history of advanced dementia, hypertension, anxiety, hyperlipidemia, hypothyroidism who presents to the hospital due to recurrent falls and underwent a CT of the head and MRI which confirmed a subarachnoid hemorrhage.  1.  Subarachnoid hemorrhage 2.  Advanced dementia 3.  Essential hypertension 4.  Hyperlipidemia 5.  Hypothyroidism 6.  Adult failure to thrive.  Patient was admitted to the hospital due to the subarachnoid hemorrhage secondary to  trauma from her recurrent falls.  Seen by neurology and neurosurgery but given her advanced dementia they do not recommend any acute intervention. -Patient's prognosis is quite poor given her advanced dementia and after further discussions with palliative care and specialists patient's family has opted for comfort care only. -Continue Ativan, morphine as needed, glycopyrrolate.  Social work made aware and consider disposition to her skilled nursing with palliative/hospice versus hospice home.     All the records are reviewed and case discussed with Care Management/Social Worker. Management plans discussed with the patient, family and they are in agreement.  CODE STATUS: DNR  TOTAL TIME TAKING CARE OF THIS PATIENT: 30 minutes.   POSSIBLE D/C IN 1-2 DAYS, DEPENDING ON CLINICAL CONDITION.   Houston Siren M.D on 07/19/2019 at 3:19 PM  Between 7am to 6pm - Pager - [phone]  After 6pm go to [email or web address] - [facility]  Office  [phone]  CC: Primary care physician; [facility]

## 2019-07-19 NOTE — Consult Note (Signed)
Name: Erica Morton MRN: [id number] DOB: 1945-11-07    ADMISSION DATE:  07/18/2019 CONSULTATION DATE:  07/19/2019  REFERRING MD : Webb Silversmith, NP  CHIEF COMPLAINT: Fall, altered mental status  BRIEF PATIENT DESCRIPTION:  73 year old female with past medical history notable for advanced dementia admitted for altered mental status in the setting of subarachnoid hemorrhage with underlying subacute small vessel ischemic changes status post fall.  Neurology and neurosurgery consulted.  SIGNIFICANT EVENTS  10/21>> presents to ED 10/21>> became hypotensive in ED following administration of IV Ativan and labetalol 10/22>> admission to stepdown 10/22>> neurology and neurosurgery consulted  STUDIES:  10/21- CT Head w/o contrast>> 1. Focal 8 mm hyperdensity along the left convexity at the junction of the temporal and parietal lobes, favored to represent small amount of extra-axial hemorrhage/probable subarachnoid hemorrhage given history of multiple falls. Given ovoid configuration however small hemorrhagic mass is also considered in the differential. Evaluation with MRI may be considered. 2. Focal hypodensity in the left frontal lobe, new since January 2020, with an appearance suggesting subacute to developing chronic infarction. 3. Atrophy and small vessel ischemic changes of the white matter. 10/21- MRI Brain>> 1. Approximate 1 cm focus of acute subarachnoid hemorrhage involving the posterior left temporal region, corresponding with abnormality on prior CT. No underlying mass lesion. 2. 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 Finding is new relative to most recent available MRI from 2016. 3. 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 4. 7 mm focus of mild diffusion  abnormality involving the left frontal periventricular white matter, consistent with subacute small vessel ischemic change. 5. 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 6. Underlying atrophy with mild to moderate chronic microvascular ischemic disease.  CULTURES: SARS-CoV-2 PCR 10/22>> negative  ANTIBIOTICS: N/A  HISTORY OF PRESENT ILLNESS:   Erica Morton is a 73 year old female with a past medical history notable for advanced dementia, diabetes mellitus, hypertension, hypothyroidism, anxiety and depression who presents to [facility] ED on 07/18/2019 with altered mental status status post multiple falls.  Patient currently with altered mental status, therefore history is obtained from ED and nursing notes.  Per notes the patient has had several falls over the last 5 days with injury to her head, and the daughter has noticed that the patient has been a little bit altered from baseline.  Upon arrival to the ED she was noted to be afebrile, blood pressure 201/114, and pulse 76 bpm.  She exhibited no focal neurological deficits, was awake and alert but disoriented x4.  Initial work-up in the ED revealed unremarkable CBC and BMP.  Urinalysis was negative for UTI.  COVID-19 PCR is negative.  CT head without contrast showed probable subarachnoid hemorrhage and focal hypodensity in the left frontal lobe suggesting subacute to developing chronic infarction.  Follow-up MRI of the brain showed acute subarachnoid hemorrhage involving the posterior left temporal region and prior traumatic subarachnoid hemorrhage and 7 mm mild diffuse abnormality involving the left frontal periventricular white matter consistent with subacute small vessel ischemic change.  She was to be admitted to the MedSurg unit by hospitalist for further work-up and treatment with consultation to neurology and neurosurgery.  However while in the ED she became hypotensive following administration of  IV Ativan and labetalol.  Blood pressure has normalized following administration of 1 L normal saline bolus.  She is now being  admitted to stepdown unit for further work-up and treatment of altered mental status in the setting of subarachnoid hemorrhage with Underlying Subacute small vessel ischemic change status post falls. PCCM is consulted for further management.  Of note, per hospitalist documentation, patient's daughter (healthcare power of attorney) expressed that she would not wish to escalate care if her mother's condition were to deteriorate.     PAST MEDICAL HISTORY :   has a past medical history of Anxiety, Dementia ([place]), Depression, Hyperlipidemia, Hypertension, Memory deficit, Thyroid disease, and Vitamin D deficiency.  has a past surgical history that includes Abdominal hysterectomy. Prior to Admission medications   Medication Sig Start Date End Date Taking? Authorizing Provider  Ascorbic Acid (VITAMIN C) 1000 MG tablet Take 1,000 mg by mouth daily.   Yes [provider]  Cholecalciferol (VITAMIN D) 50 MCG (2000 UT) tablet Take 2,000 Units by mouth daily.   Yes [provider]  Docusate Sodium 150 MG/15ML syrup Take 100 mg by mouth 2 (two) times daily.   Yes [provider]  escitalopram (LEXAPRO) 10 MG tablet Take 10 mg by mouth daily.    Yes [provider]  levothyroxine (SYNTHROID, LEVOTHROID) 100 MCG tablet Take 100 mcg by mouth daily.    Yes [provider]  Melatonin 3 MG TABS Take 3 mg by mouth at bedtime as needed (sleep).    Yes [provider]  meloxicam (MOBIC) 7.5 MG tablet Take 7.5 mg by mouth daily.   Yes [provider]  mirtazapine (REMERON) 15 MG tablet Take 15 mg by mouth at bedtime.    Yes [provider]  neomycin-bacitracin-polymyxin (NEOSPORIN) ointment Apply 1 application topically daily. (apply to the left eyebrow and left knee)   Yes [provider]  NONFORMULARY OR COMPOUNDED  ITEM Apply 1 application topically every 6 (six) hours as needed (anxiety). **LORAZEPAM 0.5mg /ml Topical Gel**   Yes [provider]  OLANZapine (ZYPREXA) 5 MG tablet Take 5 mg by mouth every evening.   Yes [provider]  rivastigmine (EXELON) 4.6 mg/24hr Place 4.6 mg onto the skin daily.   Yes [provider]  acetaminophen (TYLENOL) 325 MG tablet Take 2 tablets (650 mg total) by mouth 3 (three) times daily. Patient not taking: Reported on 07/18/2019 10/22/18   Vaughan Basta, MD  magnesium citrate SOLN Take 148 mLs (0.5 Bottles total) by mouth daily as needed for severe constipation. Patient not taking: Reported on 02/23/2019 10/22/18   Vaughan Basta, MD   Allergies  Allergen Reactions   Metformin Diarrhea    FAMILY HISTORY:  family history includes Heart attack in her mother; Non-Hodgkin's lymphoma in her father. SOCIAL HISTORY:  reports that she has never smoked. She has never used smokeless tobacco. She reports that she does not drink alcohol or use drugs.   COVID-19 DISASTER DECLARATION:  FULL CONTACT PHYSICAL EXAMINATION WAS NOT POSSIBLE DUE TO TREATMENT OF COVID-19 AND  CONSERVATION OF PERSONAL PROTECTIVE EQUIPMENT, LIMITED EXAM FINDINGS INCLUDE-  Patient assessed or the symptoms described in the history of present illness.  In the context of the Global COVID-19 pandemic, which necessitated consideration that the patient might be at risk for infection with the SARS-CoV-2 virus that causes COVID-19, Institutional protocols and algorithms that pertain to the evaluation of patients at risk for COVID-19 are in a state of rapid change based on information released by regulatory bodies including the CDC and federal and state organizations. These policies and algorithms were followed during the patient's care while in  hospital.  REVIEW OF SYSTEMS:   Unable to assess due to altered mental status  SUBJECTIVE:  Unable to assess due to altered  mental status  VITAL SIGNS: Temp:  [97.9 F (36.6 C)] 97.9 F (36.6 C) (10/21 1709) Pulse Rate:  [51-76] 52 (10/22 0200) Resp:  [12-24] 14 (10/22 0200) BP: (61-212)/(41-140) 116/49 (10/22 0200) SpO2:  [95 %-100 %] 99 % (10/22 0200) Weight:  [54.4 kg] 54.4 kg (10/21 1709)  PHYSICAL EXAMINATION: General: Acutely ill-appearing female, laying in bed, sedated, no acute distress Neuro: Sedated following Ativan administration in the ED, withdraws from pain, pupils PERRLA HEENT: Atraumatic, normocephalic, neck supple, no JVD Cardiovascular: Bradycardia, regular rhythm, S1-S2, no murmurs, rubs, or gallops Lungs: Clear to auscultation bilaterally, no wheezing, even, nonlabored, normal effort Abdomen: Soft, nontender, nondistended, no guarding or rebound tenderness, bowel sounds positive x4 Musculoskeletal: No deformities, no edema Skin: Warm and dry.  No obvious rashes, lesions, or ulcerations  Recent Labs  Lab 07/18/19 1748  NA 140  K 4.6  CL 104  CO2 25  BUN 24*  CREATININE 0.77  GLUCOSE 142*   Recent Labs  Lab 07/18/19 1748  HGB 14.6  HCT 42.4  WBC 10.3  PLT 226   Ct Head Wo Contrast  Addendum Date: 07/18/2019   ADDENDUM REPORT: 07/18/2019 18:38 ADDENDUM: Critical Value/emergent results were called by telephone at the time of interpretation on 07/18/2019 at 6:38 pm to providerMARK QUALE , who verbally acknowledged these results. Electronically Signed   By: Jasmine PangKim  Fujinaga M.D.   On: 07/18/2019 18:38   Result Date: 07/18/2019 CLINICAL DATA:  Altered mental status EXAM: CT HEAD WITHOUT CONTRAST TECHNIQUE: Contiguous axial images were obtained from the base of the skull through the vertex without intravenous contrast. COMPARISON:  CT brain 10/20/2018 FINDINGS: Brain: New hypodensity within the left frontal lobe since January 2020, appearance suggests subacute to developing chronic infarct. 8 mm focal hyperdensity at the left parietal temporal junction either representing a small  focus of extra-axial/subarachnoid hemorrhage, though given slightly ovoid configuration, hemorrhagic mass is also possible. No midline shift or significant mass effect. Atrophy. Small vessel ischemic changes of the white matter. Stable ventricle size. Vascular: No hyperdense vessel.  Carotid vascular calcification. Skull: Normal. Negative for fracture or focal lesion. Sinuses/Orbits: No acute finding. Other: None IMPRESSION: 1. Focal 8 mm hyperdensity along the left convexity at the junction of the temporal and parietal lobes, favored to represent small amount of extra-axial hemorrhage/probable subarachnoid hemorrhage given history of multiple falls. Given ovoid configuration however small hemorrhagic mass is also considered in the differential. Evaluation with MRI may be considered. 2. Focal hypodensity in the left frontal lobe, new since January 2020, with an appearance suggesting subacute to developing chronic infarction. 3. Atrophy and small vessel ischemic changes of the white matter. Electronically Signed: By: Jasmine PangKim  Fujinaga M.D. On: 07/18/2019 18:31   Mr Brain Wo Contrast  Result Date: 07/18/2019 CLINICAL DATA:  Initial evaluation for subarachnoid hemorrhage, history of multiple recent falls, altered mental status. EXAM: MRI HEAD WITHOUT CONTRAST TECHNIQUE: Multiplanar, multiecho pulse sequences of the brain and surrounding structures were obtained without intravenous contrast. COMPARISON:  Prior head CT from earlier the same day. FINDINGS: Brain: Diffuse prominence of the CSF containing spaces compatible with generalized age-related cerebral atrophy. Patchy and confluent T2/FLAIR hyperintensity within the periventricular deep white matter both cerebral hemispheres most consistent with chronic small vessel ischemic disease, mild to moderate in nature. Focal area of encephalomalacia and gliosis involving the left frontal operculum consistent  with a chronic left MCA territory infarct (series 15, image 36).  There is a 7 mm focus of mild diffusion abnormality involving the left frontal periventricular white matter, adjacent to the left frontal horn, consistent with a subacute small vessel type white matter infarct (series 5, image 28). No other evidence for acute or subacute ischemia. Small focus of acute subarachnoid hemorrhage measuring approximately 1 cm seen at the posterior left temporal region (series 15, image 29). No underlying mass. 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 Findings are new relative to previous MRI. Additionally, focal area of encephalomalacia and gliosis involving the anterior right temporal horn consistent with a chronic cortical contusion, also new from previous MRI (series 15, image 17). Few additional scattered superimposed chronic micro hemorrhages noted involving both cerebral hemispheres, nonspecific, but likely hypertensive/small vessel related. No mass lesion, midline shift or mass effect. No hydrocephalus. No intraventricular hemorrhage. No extra-axial fluid collection. Pituitary gland suprasellar region normal. Midline structures intact. Vascular: Major intracranial vascular flow voids are maintained. Skull and upper cervical spine: Craniocervical junction within normal limits. Upper cervical spine normal. Bone marrow signal intensity within normal limits. No acute abnormality about the scalp. Sinuses/Orbits: Globes and orbital soft tissues within normal limits. Paranasal sinuses are clear. No mastoid effusion. Inner ear structures grossly normal. Other: None. IMPRESSION: 1. Approximate 1 cm focus of acute subarachnoid hemorrhage involving the posterior left temporal region, corresponding with abnormality on prior CT. No underlying mass lesion. 2. Extensive superficial siderosis/hemosiderin staining  involving the cerebral hemispheres bilaterally, left greater than right, consistent with previous subarachnoid hemorrhage. Finding is new relative to most recent available MRI from 2016. 3. 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 4. 7 mm focus of mild diffusion abnormality involving the left frontal periventricular white matter, consistent with subacute small vessel ischemic change. 5. 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 6. Underlying atrophy with mild to moderate chronic microvascular ischemic disease. Electronically Signed   By: Rise Mu M.D.   On: 07/18/2019 22:28    ASSESSMENT / PLAN:  Altered Mental Status secondary to [facility] with Underlying Subacute small vessel ischemic change Hx: Advanced dementia, anxiety, depression -ICU monitoring  -Frequent neuro checks -Neurology and neurosurgery consulted, appreciate input -Hold antiplatelets and anticoagulation -N.p.o. pending stroke swallow screen -PT, OT, speech therapy consulted -Continue rivastigmine, Zyprexa, Remeron  Hypertension Hyperlipidemia -Continuous cardiac monitoring -Maintain blood pressure <140/90 -IV labetalol as needed -Continue atorvastatin  Diabetes mellitus -CBGs -Sliding scale insulin -Follow ICU hypohyperglycemia protocol  Hypothyroidism -Continue home Synthroid       DISPOSITON: Stepdown GOALS OF CARE: Full code.  Per hospitalist note, patient's daughter would not want to escalate care at this time if her mother were to deteriorate, and would like to discuss end-of-life care and DNR status with the family later this morning before making decision VTE PROPHYLAXIS: SCDs (no chemical prophylaxis given SAH) UPDATES: No family present at bedside for update during NP rounds 07/19/2019 .  Harlon Ditty, AGACNP-BC [facility] Pager:  [phone] Cell: [phone]  07/19/2019, 2:11 AM

## 2019-07-19 NOTE — Progress Notes (Signed)
OT Cancellation Note  Patient Details Name: Erica Morton MRN: [id number] DOB: 04/30/1946   Cancelled Treatment:    Reason Eval/Treat Not Completed: Other (comment) Initial consult received. Discontinuation of said consult received and acknowledged. Will sign off.   Jeni Salles, MPH, MS, OTR/L ascom [phone] 07/19/19, 10:03 AM

## 2019-07-19 NOTE — ED Notes (Signed)
Patient is resting comfortably. 

## 2019-07-19 NOTE — Progress Notes (Signed)
clinical status relayed to family  Updated and notified of patients medical condition-  Progressive multiorgan failure with very low chance of meaningful recovery.  Patient is in dying  Process.  Family understands the situation.  They have consented and agreed to DNR/DNI   Family are satisfied with Plan of action and management. All questions answered  Neizan Debruhl David Muhanad Torosyan, M.D.  [facility]  Medical Director ICU-[facility] Medical Director [facility]io-P[facility] Department     

## 2019-07-20 NOTE — Progress Notes (Addendum)
Follow up visit made. Patient seen lying in bed, eyes closed, facial muscles relaxed, apnea noted, family at bedside and reports this was present yesterday. Patient remains on a continuous morphine drip at 2mg /hr, 1 bolus given overnight. Family and hospital care team aware there is currently no bed availability at the hospice home. Will continue to follow.  Hospice home staff will contact weekend TOC  if bed becomes available. Hospice home staff will be provided with the [facility]  weekend [place] phone numbers. Flo Shanks BSN, RN, [facility] [phone]

## 2019-07-20 NOTE — Progress Notes (Signed)
Patient still unresponsive. Opens eyes occasionally but does not try to focus on family. Moves extreme ties to pain. B/P low. Family in and out. No beds available at [place] or [facility].

## 2019-07-20 NOTE — Progress Notes (Signed)
[employer] [facility] [facility] [place] [facility]     PATIENT NAME: Erica Morton    MR#:  [id number]  DATE OF BIRTH:  10-Nov-1945  SUBJECTIVE:   No acute events overnight. Pt. Appears comfortable on morphine gtt.   REVIEW OF SYSTEMS:    Review of Systems  Unable to perform ROS: Mental acuity    Nutrition: NPO Tolerating Diet: No   DRUG ALLERGIES:   Allergies  Allergen Reactions   Metformin Diarrhea    VITALS:  Blood pressure (!) 86/49, pulse (!) 49, temperature 98.4 F (36.9 C), resp. rate 10, height 5\' 2"  (1.575 m), weight 50.9 kg, SpO2 96 %.  PHYSICAL EXAMINATION:   Physical Exam  GENERAL:  73 y.o.-year-old sedated and encephalopathic but in no apparent distress. EYES: Pupils equal, round, reactive to light. NO scleral icterus.  HEENT: Head atraumatic, normocephalic. Oropharynx and nasopharynx clear.  NECK:  Supple, no jugular venous distention. No thyroid enlargement, no tenderness.  LUNGS: Normal breath sounds bilaterally, no wheezing, rales, rhonchi. No use of accessory muscles of respiration.  CARDIOVASCULAR: S1, S2 normal. No murmurs, rubs, or gallops.  ABDOMEN: Soft, nontender, nondistended. Bowel sounds present. No organomegaly or mass.  EXTREMITIES: No cyanosis, clubbing or edema b/l.    NEUROLOGIC: Encephalopathic/lethergic.  PSYCHIATRIC: The patient is alert and oriented x 1.  SKIN: No obvious rash, lesion, or ulcer.    LABORATORY PANEL:   CBC Recent Labs  Lab 07/18/19 1748  WBC 10.3  HGB 14.6  HCT 42.4  PLT 226   ------------------------------------------------------------------------------------------------------------------  Chemistries  Recent Labs  Lab 07/19/19 0444  NA 142  K 4.1  CL 110  CO2 24  GLUCOSE 120*  BUN 20  CREATININE 0.87  CALCIUM 8.2*   ------------------------------------------------------------------------------------------------------------------  Cardiac Enzymes No results for input(s): TROPONINI  in the last 168 hours. ------------------------------------------------------------------------------------------------------------------  RADIOLOGY:  Ct Head Wo Contrast  Addendum Date: 07/18/2019   ADDENDUM REPORT: 07/18/2019 18:38 ADDENDUM: Critical Value/emergent results were called by telephone at the time of interpretation on 07/18/2019 at 6:38 pm to providerMARK QUALE , who verbally acknowledged these results. Electronically Signed   By: Jasmine PangKim  Fujinaga M.D.   On: 07/18/2019 18:38   Result Date: 07/18/2019 CLINICAL DATA:  Altered mental status EXAM: CT HEAD WITHOUT CONTRAST TECHNIQUE: Contiguous axial images were obtained from the base of the skull through the vertex without intravenous contrast. COMPARISON:  CT brain 10/20/2018 FINDINGS: Brain: New hypodensity within the left frontal lobe since January 2020, appearance suggests subacute to developing chronic infarct. 8 mm focal hyperdensity at the left parietal temporal junction either representing a small focus of extra-axial/subarachnoid hemorrhage, though given slightly ovoid configuration, hemorrhagic mass is also possible. No midline shift or significant mass effect. Atrophy. Small vessel ischemic changes of the white matter. Stable ventricle size. Vascular: No hyperdense vessel.  Carotid vascular calcification. Skull: Normal. Negative for fracture or focal lesion. Sinuses/Orbits: No acute finding. Other: None IMPRESSION: 1. Focal 8 mm hyperdensity along the left convexity at the junction of the temporal and parietal lobes, favored to represent small amount of extra-axial hemorrhage/probable subarachnoid hemorrhage given history of multiple falls. Given ovoid configuration however small hemorrhagic mass is also considered in the differential. Evaluation with MRI may be considered. 2. Focal hypodensity in the left frontal lobe, new since January 2020, with an appearance suggesting subacute to developing chronic infarction. 3. Atrophy and small  vessel ischemic changes of the white matter. Electronically Signed: By: Jasmine PangKim  Fujinaga M.D. On: 07/18/2019 18:31   Mr Brain  Wo Contrast  Result Date: 07/18/2019 CLINICAL DATA:  Initial evaluation for subarachnoid hemorrhage, history of multiple recent falls, altered mental status. EXAM: MRI HEAD WITHOUT CONTRAST TECHNIQUE: Multiplanar, multiecho pulse sequences of the brain and surrounding structures were obtained without intravenous contrast. COMPARISON:  Prior head CT from earlier the same day. FINDINGS: Brain: Diffuse prominence of the CSF containing spaces compatible with generalized age-related cerebral atrophy. Patchy and confluent T2/FLAIR hyperintensity within the periventricular deep white matter both cerebral hemispheres most consistent with chronic small vessel ischemic disease, mild to moderate in nature. Focal area of encephalomalacia and gliosis involving the left frontal operculum consistent with a chronic left MCA territory infarct (series 15, image 36). There is a 7 mm focus of mild diffusion abnormality involving the left frontal periventricular white matter, adjacent to the left frontal horn, consistent with a subacute small vessel type white matter infarct (series 5, image 28). No other evidence for acute or subacute ischemia. Small focus of acute subarachnoid hemorrhage measuring approximately 1 cm seen at the posterior left temporal region (series 15, image 29). No underlying mass. 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 Findings are new relative to previous MRI. Additionally, focal area of encephalomalacia and gliosis involving the anterior right temporal horn consistent with a chronic cortical contusion, also new from previous MRI (series 15, image 17). Few additional scattered superimposed chronic micro  hemorrhages noted involving both cerebral hemispheres, nonspecific, but likely hypertensive/small vessel related. No mass lesion, midline shift or mass effect. No hydrocephalus. No intraventricular hemorrhage. No extra-axial fluid collection. Pituitary gland suprasellar region normal. Midline structures intact. Vascular: Major intracranial vascular flow voids are maintained. Skull and upper cervical spine: Craniocervical junction within normal limits. Upper cervical spine normal. Bone marrow signal intensity within normal limits. No acute abnormality about the scalp. Sinuses/Orbits: Globes and orbital soft tissues within normal limits. Paranasal sinuses are clear. No mastoid effusion. Inner ear structures grossly normal. Other: None. IMPRESSION: 1. Approximate 1 cm focus of acute subarachnoid hemorrhage involving the posterior left temporal region, corresponding with abnormality on prior CT. No underlying mass lesion. 2. 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 Finding is new relative to most recent available MRI from 2016. 3. 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 4. 7 mm focus of mild diffusion abnormality involving the left frontal periventricular white matter, consistent with subacute small vessel ischemic change. 5. 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 6. Underlying atrophy with mild to moderate chronic microvascular ischemic disease. Electronically Signed   By: Jeannine Boga M.D.   On: 07/18/2019 22:28     ASSESSMENT AND PLAN:   73 year old female with past medical history of advanced dementia, hypertension, anxiety, hyperlipidemia, hypothyroidism who presents to the hospital due to recurrent falls and underwent a CT of the  head and MRI which confirmed a subarachnoid hemorrhage.  1.  Subarachnoid hemorrhage 2.  Advanced dementia 3.  Essential hypertension 4.  Hyperlipidemia 5.  Hypothyroidism 6.  Adult failure to thrive.  Patient was admitted to the hospital due to the subarachnoid hemorrhage secondary to trauma from her recurrent falls.  Seen by neurology and neurosurgery but given her advanced dementia they do not recommend any acute intervention. -Patient's prognosis is quite poor given her advanced  dementia and after further discussions with palliative care and specialists patient's family has opted for comfort care only. -Continue morphine drip, Ativan as needed and glycopyrrolate.  -Await disposition either SNF with hospice or hospice home.  Await further social work input.     All the records are reviewed and case discussed with Care Management/Social Worker. Management plans discussed with the patient, family and they are in agreement.  CODE STATUS: DNR  TOTAL TIME TAKING CARE OF THIS PATIENT: 25 minutes.   POSSIBLE D/C unclear days,  DEPENDING ON CLINICAL CONDITION.   Houston Siren M.D on 07/20/2019 at 12:05 PM  Between 7am to 6pm - Pager - [phone]  After 6pm go to [email or web address] - [facility]  Office  [phone]  CC: Primary care physician; [facility]

## 2019-07-21 NOTE — Progress Notes (Signed)
[place] at [place] NAME: Erica Morton    MR#:  [id number]  DATE OF BIRTH:  23-Jul-1946  SUBJECTIVE:  Family at bedside.  No acute events overnight.  Patient awaiting hospice home bed  REVIEW OF SYSTEMS:    Review of Systems  Unable to perform ROS: Mental acuity       DRUG ALLERGIES:   Allergies  Allergen Reactions  . Metformin Diarrhea    VITALS:  Blood pressure 106/68, pulse (!) 59, temperature 98.5 F (36.9 C), temperature source Oral, resp. rate 17, height 5\' 2"  (1.575 m), weight 50.9 kg, SpO2 99 %.  PHYSICAL EXAMINATION:   Physical Exam  GENERAL:  73 y.o.-year-old sedated and encephalopathic but in no apparent distress. Critically ill-appearing  LABORATORY PANEL:   CBC Recent Labs  Lab 07/18/19 1748  WBC 10.3  HGB 14.6  HCT 42.4  PLT 226   ------------------------------------------------------------------------------------------------------------------  Chemistries  Recent Labs  Lab 07/19/19 0444  NA 142  K 4.1  CL 110  CO2 24  GLUCOSE 120*  BUN 20  CREATININE 0.87  CALCIUM 8.2*   ------------------------------------------------------------------------------------------------------------------  Cardiac Enzymes No results for input(s): TROPONINI in the last 168 hours. ------------------------------------------------------------------------------------------------------------------  RADIOLOGY:  No results found.   ASSESSMENT AND PLAN:   73 year old female with past medical history of advanced dementia, hypertension, anxiety, hyperlipidemia, hypothyroidism who presents to the hospital due to recurrent falls and underwent a CT of the head and MRI which confirmed a subarachnoid hemorrhage.  1.  Subarachnoid hemorrhage 2.  Advanced dementia 3.  Essential hypertension 4.  Hyperlipidemia 5.  Hypothyroidism 6.  Adult failure to thrive.  Patient was admitted to the hospital due to the  subarachnoid hemorrhage secondary to trauma from her recurrent falls.  Seen by neurology and neurosurgery but given her advanced dementia they do not recommend any acute intervention. -Patient's prognosis is quite poor given her advanced dementia and after further discussions with palliative care and specialists patient's family has opted for comfort care only. -Continue morphine drip, Ativan as needed and glycopyrrolate.      All the records are reviewed and case discussed with Care Management/Social Worker. Management plans discussed with the patient's family and they are in agreeement  CODE STATUS: DNR  TOTAL TIME TAKING CARE OF THIS PATIENT: 15 minutes.   Waiting for hospice home bed  Bettey Costa M.D on 07/21/2019 at 9:27 AM  Between 7am to 6pm - Pager - [phone]  After 6pm go to [email or web address] - [facility]  Office  [phone]  CC: Primary care physician; [facility]

## 2019-07-22 NOTE — Discharge Summary (Addendum)
[place] at [place] NAME: Erica Morton    MR#:  [id number]  DATE OF BIRTH:  [birth year]  DATE OF ADMISSION:  07/18/2019 ADMITTING PHYSICIAN: Lang Snow, NP  DATE OF DISCHARGE: 07/23/2019  PRIMARY CARE PHYSICIAN: [facility]    ADMISSION DIAGNOSIS:  Somnolence [R40.0] Traumatic hemorrhage of cerebrum with loss of consciousness, unspecified laterality, initial encounter ([place]) [D92.426S] SAH (subarachnoid hemorrhage) ([place]) [I60.9]  DISCHARGE DIAGNOSIS:  Active Problems:   SAH (subarachnoid hemorrhage) ([place])   SECONDARY DIAGNOSIS:   Past Medical History:  Diagnosis Date  . Anxiety   . Dementia ([place])   . Depression   . Hyperlipidemia   . Hypertension   . Memory deficit   . Thyroid disease   . Vitamin D deficiency     HOSPITAL COURSE:  73 year old female with past medical history of advanced dementia, hypertension, anxiety, hyperlipidemia, hypothyroidism who presents to the hospital due to recurrent falls and underwent a CT of the head and MRI which confirmed a subarachnoid hemorrhage.  1.  Subarachnoid hemorrhage 2.  Advanced dementia 3.  Essential hypertension 4.  Hyperlipidemia 5.  Hypothyroidism 6.  Adult failure to thrive.  Patient was admitted to the hospital due to the subarachnoid hemorrhage secondary to trauma from her recurrent falls.  Seen by neurology and neurosurgery but given her advanced dementia they do not recommend any acute intervention. -Patient's prognosis is quite poor given her advanced dementia and after further discussions with palliative care and specialists patient's family has opted for comfort care only.    DISCHARGE CONDITIONS AND DIET:   guarded  CONSULTS OBTAINED:  Treatment Team:  Alexis Goodell, MD Meade Maw, MD  DRUG ALLERGIES:   Allergies  Allergen Reactions  . Metformin Diarrhea    DISCHARGE MEDICATIONS:   Allergies as of  07/22/2019      Reactions   Metformin Diarrhea      Medication List    STOP taking these medications   acetaminophen 325 MG tablet Commonly known as: TYLENOL   Docusate Sodium 150 MG/15ML syrup   escitalopram 10 MG tablet Commonly known as: LEXAPRO   levothyroxine 100 MCG tablet Commonly known as: SYNTHROID   magnesium citrate Soln   Melatonin 3 MG Tabs   meloxicam 7.5 MG tablet Commonly known as: MOBIC   mirtazapine 15 MG tablet Commonly known as: REMERON   neomycin-bacitracin-polymyxin ointment Commonly known as: NEOSPORIN   NONFORMULARY OR COMPOUNDED ITEM   OLANZapine 5 MG tablet Commonly known as: ZYPREXA   rivastigmine 4.6 mg/24hr Commonly known as: EXELON   vitamin C 1000 MG tablet   Vitamin D 50 MCG (2000 UT) tablet         Today   CHIEF COMPLAINT:  Family at bedside   VITAL SIGNS:  Blood pressure (!) 132/55, pulse (!) 59, temperature 98.3 F (36.8 C), temperature source Oral, resp. rate 16, height 5\' 2"  (1.575 m), weight 50.9 kg, SpO2 93 %.   REVIEW OF SYSTEMS:  Review of Systems  Unable to perform ROS: Acuity of condition     PHYSICAL EXAMINATION:  GENERAL:  73 y.o.-year-old patient lying in the bed with no acute distress. Critically ill appearing lethargic .   DATA REVIEW:   CBC Recent Labs  Lab 07/18/19 1748  WBC 10.3  HGB 14.6  HCT 42.4  PLT 226    Chemistries  Recent Labs  Lab 07/19/19 0444  NA 142  K 4.1  CL 110  CO2 24  GLUCOSE  120*  BUN 20  CREATININE 0.87  CALCIUM 8.2*    Cardiac Enzymes No results for input(s): TROPONINI in the last 168 hours.  Microbiology Results  @MICRORSLT48 @  RADIOLOGY:  No results found.    Allergies as of 07/22/2019      Reactions   Metformin Diarrhea      Medication List    STOP taking these medications   acetaminophen 325 MG tablet Commonly known as: TYLENOL   Docusate Sodium 150 MG/15ML syrup   escitalopram 10 MG tablet Commonly known as: LEXAPRO    levothyroxine 100 MCG tablet Commonly known as: SYNTHROID   magnesium citrate Soln   Melatonin 3 MG Tabs   meloxicam 7.5 MG tablet Commonly known as: MOBIC   mirtazapine 15 MG tablet Commonly known as: REMERON   neomycin-bacitracin-polymyxin ointment Commonly known as: NEOSPORIN   NONFORMULARY OR COMPOUNDED ITEM   OLANZapine 5 MG tablet Commonly known as: ZYPREXA   rivastigmine 4.6 mg/24hr Commonly known as: EXELON   vitamin C 1000 MG tablet   Vitamin D 50 MCG (2000 UT) tablet          Management plans discussed with the patient's family and they are in agreement. Stable for discharge   Patient should follow up with hospice  CODE STATUS:     Code Status Orders  (From admission, onward)         Start     Ordered   07/19/19 0904  Do not attempt resuscitation (DNR)  Continuous    Question Answer Comment  In the event of cardiac or respiratory ARREST Do not call a "code blue"   In the event of cardiac or respiratory ARREST Do not perform Intubation, CPR, defibrillation or ACLS   In the event of cardiac or respiratory ARREST Use medication by any route, position, wound care, and other measures to relive pain and suffering. May use oxygen, suction and manual treatment of airway obstruction as needed for comfort.      07/19/19 0906        Code Status History    Date Active Date Inactive Code Status Order ID Comments User Context   07/19/2019 0729 07/19/2019 0906 DNR 07/21/2019  [id number], MD Inpatient   07/18/2019 1939 07/19/2019 0729 Full Code 07/21/2019  [id number], NP ED   10/21/2018 0059 10/22/2018 1936 Full Code 10/24/2018  [id number], MD Inpatient   Advance Care Planning Activity      TOTAL TIME TAKING CARE OF THIS PATIENT: 36 minutes.    Note: This dictation was prepared with Dragon dictation along with smaller phrase technology. Any transcriptional errors that result from this process are unintentional.  Oralia Manis M.D on  07/22/2019 at 11:06 AM  Between 7am to 6pm - Pager - [phone] After 6pm go to [email or web address] - 12-10-1978  [facility]  Office  [phone]  CC: Primary care physician; [facility]

## 2019-07-22 NOTE — Progress Notes (Signed)
[facility] at [place] NAME: Erica Morton    MR#:  [id number]  DATE OF BIRTH:  04/25/1946  SUBJECTIVE:  Family at bedside.   Waiting for hospice home  REVIEW OF SYSTEMS:    Review of Systems  Unable to perform ROS: Mental acuity       DRUG ALLERGIES:   Allergies  Allergen Reactions  . Metformin Diarrhea    VITALS:  Blood pressure (!) 132/55, pulse (!) 59, temperature 98.3 F (36.8 C), temperature source Oral, resp. rate 16, height 5\' 2"  (1.575 m), weight 50.9 kg, SpO2 93 %.  PHYSICAL EXAMINATION:   Physical Exam  GENERAL:  73 y.o.-year-old sedated and encephalopathic but in no apparent distress. Critically ill-appearing  LABORATORY PANEL:   CBC Recent Labs  Lab 07/18/19 1748  WBC 10.3  HGB 14.6  HCT 42.4  PLT 226   ------------------------------------------------------------------------------------------------------------------  Chemistries  Recent Labs  Lab 07/19/19 0444  NA 142  K 4.1  CL 110  CO2 24  GLUCOSE 120*  BUN 20  CREATININE 0.87  CALCIUM 8.2*   ------------------------------------------------------------------------------------------------------------------  Cardiac Enzymes No results for input(s): TROPONINI in the last 168 hours. ------------------------------------------------------------------------------------------------------------------  RADIOLOGY:  No results found.   ASSESSMENT AND PLAN:   73 year old female with past medical history of advanced dementia, hypertension, anxiety, hyperlipidemia, hypothyroidism who presents to the hospital due to recurrent falls and underwent a CT of the head and MRI which confirmed a subarachnoid hemorrhage.  1.  Subarachnoid hemorrhage 2.  Advanced dementia 3.  Essential hypertension 4.  Hyperlipidemia 5.  Hypothyroidism 6.  Adult failure to thrive.  Patient was admitted to the hospital due to the subarachnoid hemorrhage secondary to  trauma from her recurrent falls.  Seen by neurology and neurosurgery but given her advanced dementia they do not recommend any acute intervention. -Patient's prognosis is quite poor given her advanced dementia and after further discussions with palliative care and specialists patient's family has opted for comfort care only. -Continue morphine drip, Ativan as needed and glycopyrrolate.      All the records are reviewed and case discussed with Care Management/Social Worker. Management plans discussed with the patient's family and they are in agreeement  CODE STATUS: DNR  TOTAL TIME TAKING CARE OF THIS PATIENT: 15 minutes.   Waiting for hospice home bed  Bettey Costa M.D on 07/22/2019 at 10:56 AM  Between 7am to 6pm - Pager - [phone]  After 6pm go to [email or web address] - [facility]  Office  [phone]  CC: Primary care physician; [facility]

## 2019-07-23 NOTE — Progress Notes (Signed)
Follow up on new referral for inpatient hospice who has been on the waiting list for a bed.  Spoke with Doran Clay to notify her that we have a bed for Erica Morton today.  Spoke with Celedonio Savage, patient's daughter who is at the bedside to notify that we have a bed for her mother.   She will complete admission paperwork with the hospice home social worker Michaelle Birks via docusign.  EMS transport being arranged for 1200 pick up by this RN.  Report to hospice home staff given.  Portable DNR to accompany the patient for transport. Discharge summary faxed to referral intake and hospice team.  Dimas Aguas, RN Clinical Nurse Liaison [facility] [phone]

## 2019-07-23 NOTE — TOC Transition Note (Signed)
Transition of Care [facility]) - CM/SW Discharge Note   Patient Details  Name: Erica Morton MRN: [id number] Date of Birth: April 07, 1946  [facility] CM/SW Contact:  Shelbie Hutching, RN Phone Number: 07/23/2019, 11:47 AM   Clinical Narrative:    Patient discharging to residential hospice facility, [facility] home in [place].  Patient will transport via [employer].  Marcene Brawn with [facility] has spoken with the family.   Final next level of care: [place] Barriers to Discharge: Barriers Resolved   Patient Goals and CMS Choice   CMS Medicare.gov Compare Post Acute Care list provided to:: Patient Represenative (must comment)(Daughter) Choice offered to / list presented to : Adult Children  Discharge Placement              Patient chooses bed at: [facility]) Patient to be transferred to facility by: [facility] Name of family member notified: Family notified by Hospice Patient and family notified of of transfer: 07/23/19  Discharge Plan and Services     Post Acute Care Choice: Hospice                               Social Determinants of Health (SDOH) Interventions     Readmission Risk Interventions No flowsheet data found.

## 2019-07-23 NOTE — Progress Notes (Signed)
This RN and NT Anderson Malta in room changing patient. When right leg was lifted patient became very stiff and eyes were noted to stare off to the left. Right arm was jerking as well. This lasted about 15 secs then patient body relaxed. This same occurrence happened again when turning patient. Lasted the same amount of time, same symptoms with right side extremities only. No respiratory distress during episode, no change in vital signs. Daughter at bedside. Daughter reports this has never happened. Discussed with daughter that this could be a result of SAH. Daughter acknowledged that she had been told it could happen when patient was in ICU. Notified Elizabeth NP. No new orders as patient is already comfort care. Will continue to monitor patient for any pain or discomfort.

## 2019-07-23 NOTE — Care Management Important Message (Signed)
Important Message  Patient Details  Name: Erica Morton MRN: [id number] Date of Birth: [birth year]   Medicare Important Message Given:  Yes     Juliann Pulse A Sevana Grandinetti 07/23/2019, 12:10 PM

## 2019-08-28 DEATH — deceased

## 2021-04-15 IMAGING — CT CT HEAD W/O CM
3 series · 14 of 45 positions shown, 16 images · non-contrast
Comparison: CT brain 10/20/2018
COMPARISON: CT brain 10/20/2018

Addendum:
CLINICAL DATA: Altered mental status

EXAM:
CT HEAD WITHOUT CONTRAST
TECHNIQUE: Contiguous axial images were obtained from the base of the skull
through the vertex without intravenous contrast.

[Series 2: head wo · axial · 0.41mm/px · z∈[+335,+450]mm · 8 of 28 slices shown, 10 images]
[im 3/28  brain]
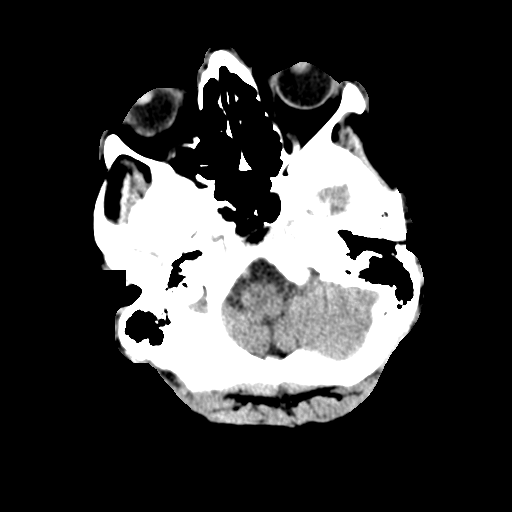
[im 3/28  bone]
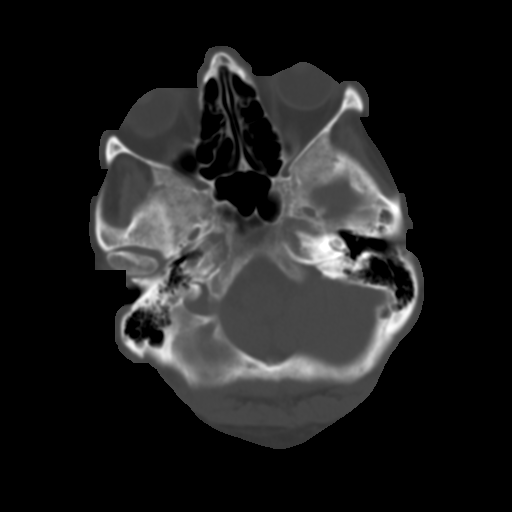
[im 6/28  brain]
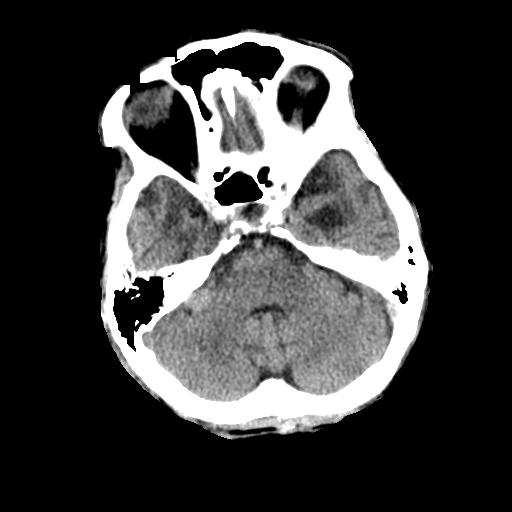
[im 10/28  brain]
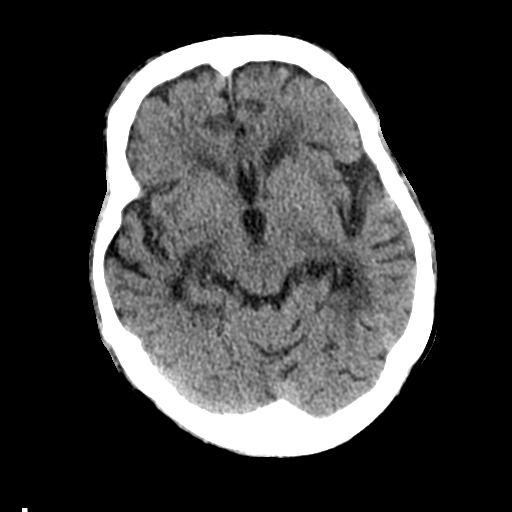
[im 13/28  brain]
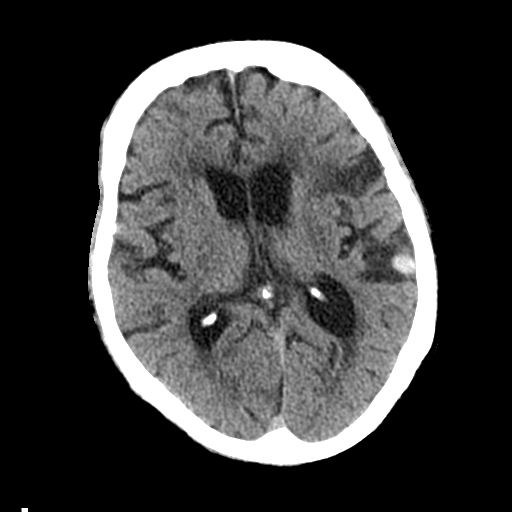
[im 16/28  brain]
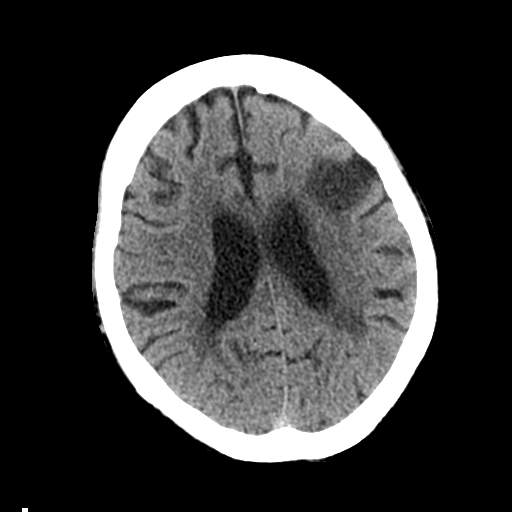
[im 16/28  bone]
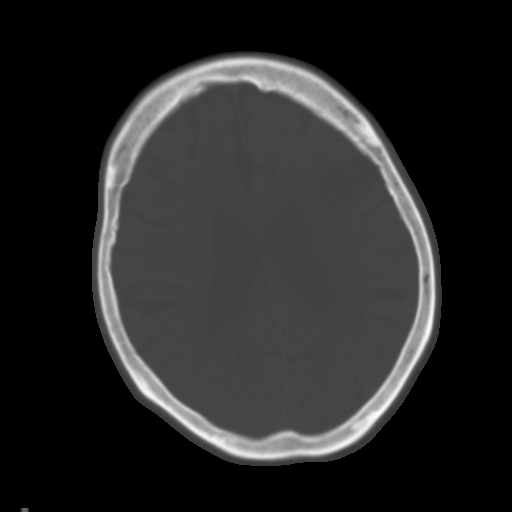
[im 19/28  brain]
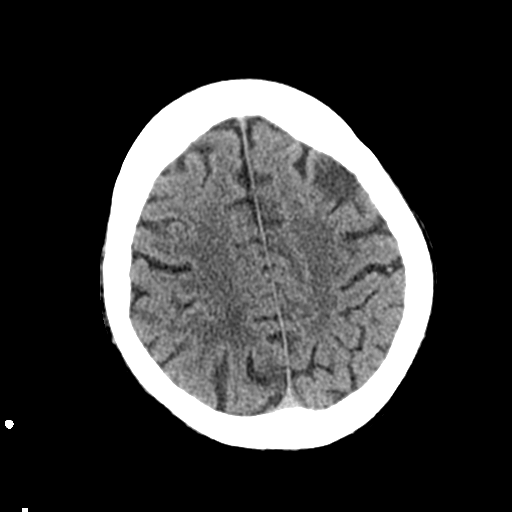
[im 23/28  brain]
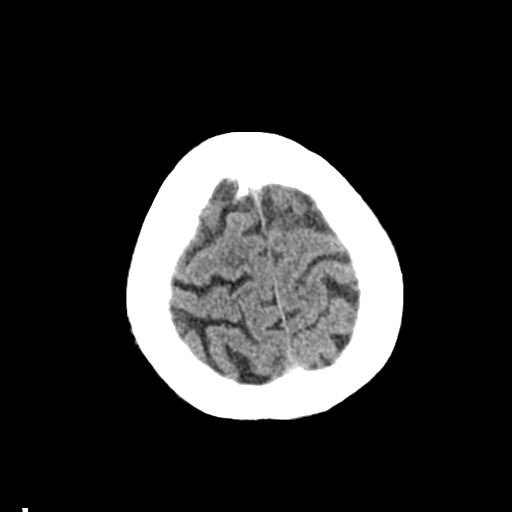
[im 26/28  brain]
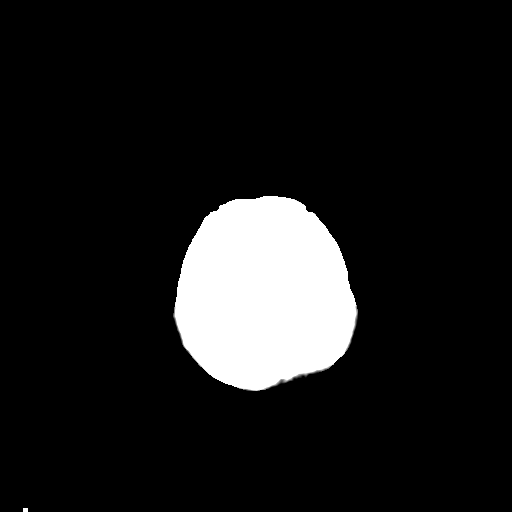

[Series 4: coronal soft tissue · coronal · 0.30mm/px · 3 of 62 slices shown]
[im 21/62  brain]
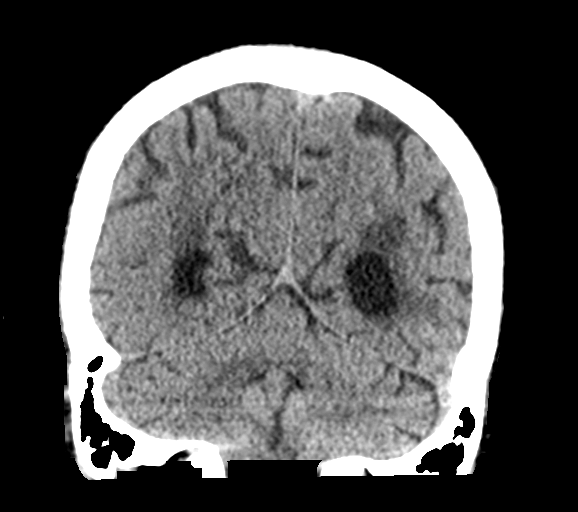
[im 28/62  brain]
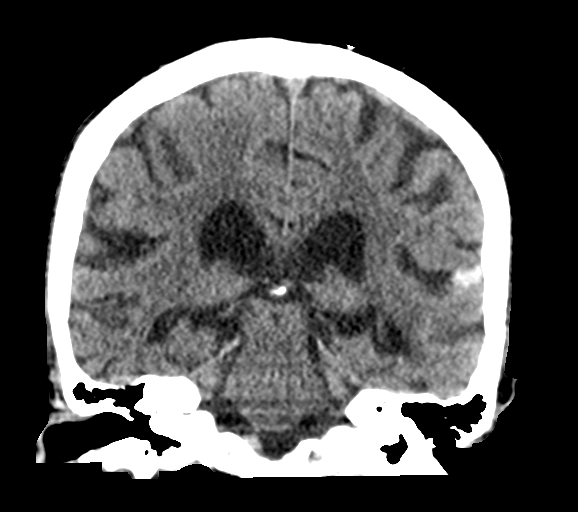
[im 34/62  brain]
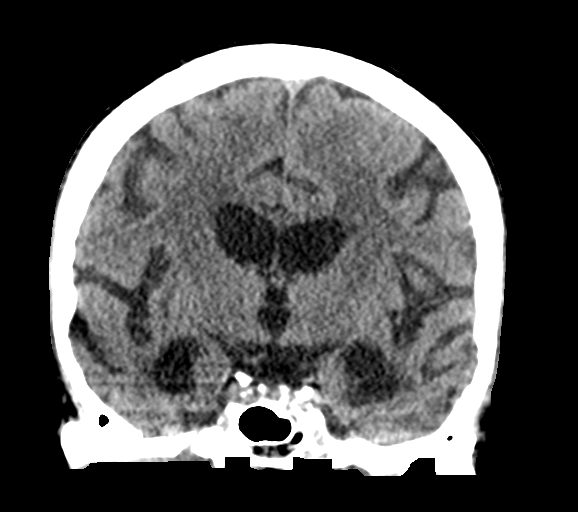

[Series 5: sagittal soft tissue · sagittal · 0.29mm/px · 3 of 51 slices shown]
[im 17/51  brain]
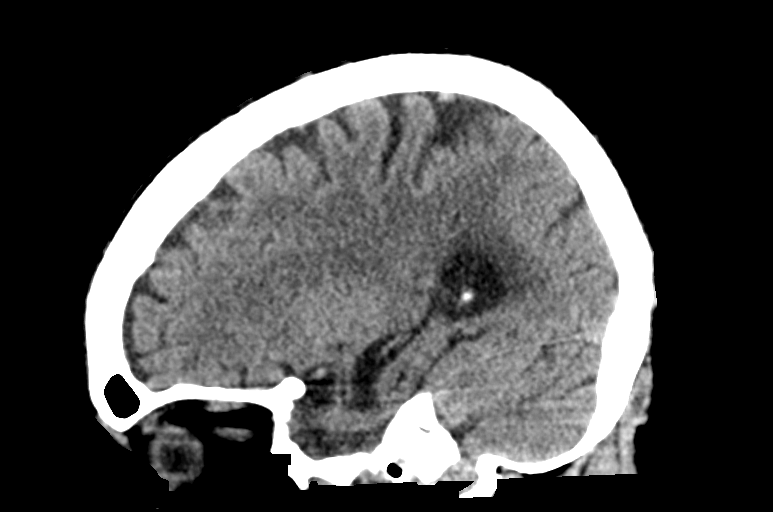
[im 26/51  brain]
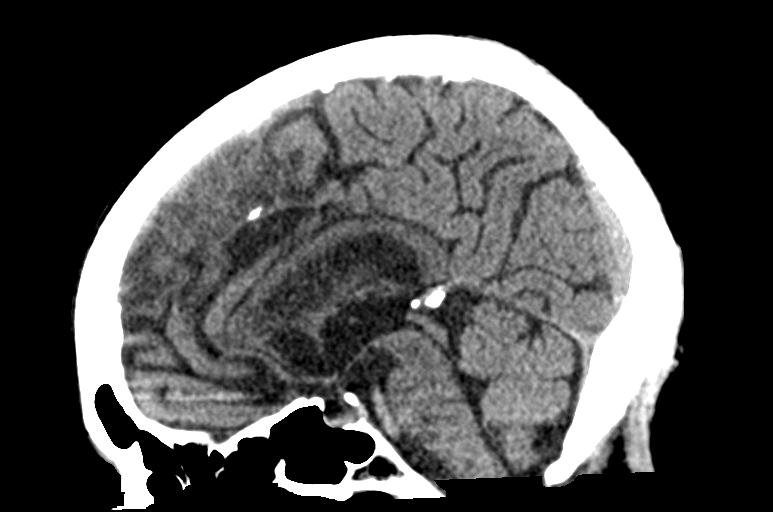
[im 34/51  brain]
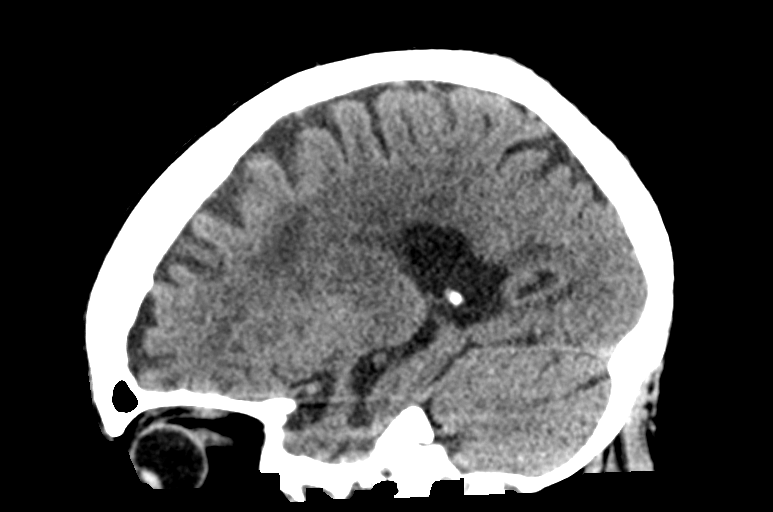

[14 of 45 positions shown; findings below may reference images not displayed]

FINDINGS: Brain: New hypodensity within the left frontal lobe since September 2018, appearance suggests subacute to developing chronic infarct. 8
mm focal hyperdensity at the left parietal temporal junction either
representing a small focus of extra-axial/subarachnoid hemorrhage,
though given slightly ovoid configuration, hemorrhagic mass is also
possible. No midline shift or significant mass effect.

Atrophy. Small vessel ischemic changes of the white matter. Stable
ventricle size.

Vascular: No hyperdense vessel.  Carotid vascular calcification.

Skull: Normal. Negative for fracture or focal lesion.

Sinuses/Orbits: No acute finding.

Other: None
IMPRESSION: 1. Focal 8 mm hyperdensity along the left convexity at the junction
of the temporal and parietal lobes, favored to represent small
amount of extra-axial hemorrhage/probable subarachnoid hemorrhage
given history of multiple falls. Given ovoid configuration however
small hemorrhagic mass is also considered in the differential.
Evaluation with MRI may be considered.
2. Focal hypodensity in the left frontal lobe, new since September 2018, with an appearance suggesting subacute to developing chronic
infarction.
3. Atrophy and small vessel ischemic changes of the white matter.

ADDENDUM:
Critical Value/emergent results were called by telephone at the time
of interpretation on 07/18/2019 at [DATE] to Titus Lantigua ,
who verbally acknowledged these results.

*** End of Addendum ***
FINDINGS: Brain: New hypodensity within the left frontal lobe since September 2018, appearance suggests subacute to developing chronic infarct. 8
mm focal hyperdensity at the left parietal temporal junction either
representing a small focus of extra-axial/subarachnoid hemorrhage,
though given slightly ovoid configuration, hemorrhagic mass is also
possible. No midline shift or significant mass effect.

Atrophy. Small vessel ischemic changes of the white matter. Stable
ventricle size.

Vascular: No hyperdense vessel.  Carotid vascular calcification.

Skull: Normal. Negative for fracture or focal lesion.

Sinuses/Orbits: No acute finding.

Other: None
IMPRESSION: 1. Focal 8 mm hyperdensity along the left convexity at the junction
of the temporal and parietal lobes, favored to represent small
amount of extra-axial hemorrhage/probable subarachnoid hemorrhage
given history of multiple falls. Given ovoid configuration however
small hemorrhagic mass is also considered in the differential.
Evaluation with MRI may be considered.
2. Focal hypodensity in the left frontal lobe, new since September 2018, with an appearance suggesting subacute to developing chronic
infarction.
3. Atrophy and small vessel ischemic changes of the white matter.
